# Patient Record
Sex: Male | Born: 1950 | Race: White | Hispanic: No | Marital: Single | Smoking: Never smoker
Health system: Southern US, Community
[De-identification: ages and names within clinical notes are randomized; demographics above are authoritative.]

## PROBLEM LIST (undated history)

## (undated) DIAGNOSIS — E785 Hyperlipidemia, unspecified: Secondary | ICD-10-CM

## (undated) HISTORY — PX: HERNIA REPAIR: SHX51

---

## 1999-04-14 ENCOUNTER — Encounter: Admission: RE | Admit: 1999-04-14 | Discharge: 1999-04-14 | Payer: Self-pay | Admitting: Family Medicine

## 1999-04-14 ENCOUNTER — Encounter: Payer: Self-pay | Admitting: Family Medicine

## 1999-05-18 ENCOUNTER — Other Ambulatory Visit: Admission: RE | Admit: 1999-05-18 | Discharge: 1999-05-18 | Payer: Self-pay | Admitting: Gastroenterology

## 2004-07-26 ENCOUNTER — Ambulatory Visit: Payer: Self-pay | Admitting: General Surgery

## 2004-10-11 ENCOUNTER — Ambulatory Visit: Payer: Self-pay | Admitting: Family Medicine

## 2005-07-28 ENCOUNTER — Ambulatory Visit: Payer: Self-pay | Admitting: Family Medicine

## 2008-03-28 ENCOUNTER — Emergency Department: Payer: Self-pay | Admitting: Emergency Medicine

## 2008-03-30 ENCOUNTER — Emergency Department: Payer: Self-pay | Admitting: Internal Medicine

## 2008-05-27 ENCOUNTER — Encounter: Admission: RE | Admit: 2008-05-27 | Discharge: 2008-08-25 | Payer: Self-pay | Admitting: Neurology

## 2015-12-30 DIAGNOSIS — M722 Plantar fascial fibromatosis: Secondary | ICD-10-CM | POA: Diagnosis not present

## 2015-12-30 DIAGNOSIS — S76111A Strain of right quadriceps muscle, fascia and tendon, initial encounter: Secondary | ICD-10-CM | POA: Diagnosis not present

## 2016-02-21 DIAGNOSIS — E7800 Pure hypercholesterolemia, unspecified: Secondary | ICD-10-CM | POA: Diagnosis not present

## 2016-02-21 DIAGNOSIS — Z0000 Encounter for general adult medical examination without abnormal findings: Secondary | ICD-10-CM | POA: Diagnosis not present

## 2016-02-25 DIAGNOSIS — Z0000 Encounter for general adult medical examination without abnormal findings: Secondary | ICD-10-CM | POA: Diagnosis not present

## 2016-03-23 ENCOUNTER — Emergency Department
Admission: EM | Admit: 2016-03-23 | Discharge: 2016-03-23 | Disposition: A | Discharge status: Home / Self Care | Payer: PPO | Attending: Emergency Medicine | Admitting: Emergency Medicine

## 2016-03-23 ENCOUNTER — Emergency Department: Payer: PPO

## 2016-03-23 ENCOUNTER — Encounter: Payer: Self-pay | Admitting: Emergency Medicine

## 2016-03-23 DIAGNOSIS — W19XXXA Unspecified fall, initial encounter: Secondary | ICD-10-CM

## 2016-03-23 DIAGNOSIS — Y92830 Public park as the place of occurrence of the external cause: Secondary | ICD-10-CM | POA: Insufficient documentation

## 2016-03-23 DIAGNOSIS — W010XXA Fall on same level from slipping, tripping and stumbling without subsequent striking against object, initial encounter: Secondary | ICD-10-CM | POA: Insufficient documentation

## 2016-03-23 DIAGNOSIS — Y9301 Activity, walking, marching and hiking: Secondary | ICD-10-CM | POA: Diagnosis not present

## 2016-03-23 DIAGNOSIS — Y999 Unspecified external cause status: Secondary | ICD-10-CM | POA: Diagnosis not present

## 2016-03-23 DIAGNOSIS — S79912A Unspecified injury of left hip, initial encounter: Secondary | ICD-10-CM | POA: Diagnosis not present

## 2016-03-23 DIAGNOSIS — S7002XA Contusion of left hip, initial encounter: Secondary | ICD-10-CM | POA: Diagnosis not present

## 2016-03-23 DIAGNOSIS — M25552 Pain in left hip: Secondary | ICD-10-CM | POA: Diagnosis not present

## 2016-03-23 DIAGNOSIS — S7012XA Contusion of left thigh, initial encounter: Secondary | ICD-10-CM | POA: Diagnosis not present

## 2016-03-23 MED ORDER — HYDROCODONE-ACETAMINOPHEN 5-325 MG PO TABS: 1.0000 | ORAL_TABLET | Freq: Four times a day (QID) | ORAL | 0 refills | Status: DC | PRN

## 2016-03-23 MED ORDER — NAPROXEN 500 MG PO TABS: 500.0000 mg | ORAL_TABLET | Freq: Two times a day (BID) | ORAL | 0 refills | Status: DC

## 2016-03-23 MED ORDER — BACLOFEN 10 MG PO TABS: 10.0000 mg | ORAL_TABLET | Freq: Three times a day (TID) | ORAL | 0 refills | Status: AC | PRN

## 2016-03-23 MED ORDER — KETOROLAC TROMETHAMINE 30 MG/ML IJ SOLN
30.0000 mg | Freq: Once | INTRAMUSCULAR | Status: AC
  Administered 2016-03-23: 30 mg via INTRAMUSCULAR
  Filled 2016-03-23: qty 1

## 2016-03-23 NOTE — ED Provider Notes (Signed)
[facility] Emergency Department Provider Note  ____________________________________________  Time seen: Approximately 4:13 PM  I have reviewed the triage vital signs and the nursing notes.   HISTORY  Chief Complaint Hip Pain and Fall    HPI Erik Donovan is a 66 y.o. male , NAD, presents to emergency department for evaluation of left hip pain after a fall. Patient states he was walking in a local park when he slipped in the snow and ice. Fell directly onto the left hip and thigh. Had immediate pain and swelling about the area after the incident. Denies any numbness, weakness or tingling. Denies head injury or neck pain. Has no back pain, saddle paresthesias or loss of bowel or bladder control. Denies any fatigue, visual changes, chest pain, shortness of breath. States that the fall was mechanical in nature.   History reviewed. No pertinent past medical history.  There are no active problems to display for this patient.   Past Surgical History:  Procedure Laterality Date  . HERNIA REPAIR      Prior to Admission medications   Medication Sig Start Date End Date Taking? Authorizing Provider  baclofen (LIORESAL) 10 MG tablet Take 1 tablet (10 mg total) by mouth 3 (three) times daily as needed for muscle spasms. 03/23/16 03/30/16  Jami L Hagler, PA-C  HYDROcodone-acetaminophen (NORCO) 5-325 MG tablet Take 1 tablet by mouth every 6 (six) hours as needed for severe pain. 03/23/16   Jami L Hagler, PA-C  naproxen (NAPROSYN) 500 MG tablet Take 1 tablet (500 mg total) by mouth 2 (two) times daily with a meal. 03/23/16   Jami L Hagler, PA-C    Allergies Patient has no known allergies.  No family history on file.  Social History Social History  Substance Use Topics  . Smoking status: Never Smoker  . Smokeless tobacco: Not on file  . Alcohol use No     Review of Systems  Constitutional: No fatigue Eyes: No visual changes. Cardiovascular: No chest  pain. Respiratory: No shortness of breath.  Musculoskeletal: Positive left hip and thigh pain. Negative for back or neck pain.  Skin: Positive swelling left thigh. Negative for rash or redness, bruising, open wounds or lacerations. Neurological: Negative for Numbness, weakness, tingling. No saddle paresthesias, loss of bowel or bladder control.  ____________________________________________   PHYSICAL EXAM:  VITAL SIGNS: ED Triage Vitals [03/23/16 1526]  Enc Vitals Group     BP 119/86     Pulse Rate 94     Resp 18     Temp 97.7 F (36.5 C)     Temp Source Oral     SpO2 96 %     Weight 200 lb (90.7 kg)     Height 5\' 10"  (1.778 m)     Head Circumference      Peak Flow      Pain Score 2     Pain Loc      Pain Edu?      Excl. in [place]?      Constitutional: Alert and oriented. Well appearing and in no acute distress. Eyes: Conjunctivae are normal.   Head: Atraumatic. Cardiovascular: Good peripheral circulation. Respiratory: Normal respiratory effort without tachypnea or retractions.  Musculoskeletal: Tenderness to palpation about the left posterior and lateral thigh with large hematoma present. No fluctuance to the hematoma is noted. No significant bruising and no redness or abnormal warmth. Patient has full range of motion of the left hip, knee without difficulty but does note pain about  the left lateral thigh with full flexion at the hip. No lower extremity tenderness nor edema.  No joint effusions. Neurologic:  Normal speech and language. Normal gait and posture. No gross focal neurologic deficits are appreciated.  Skin:  Skin is warm, dry and intact. No rash, open wounds or lacerations noted. Psychiatric: Mood and affect are normal. Speech and behavior are normal. Patient exhibits appropriate insight and judgement.   ____________________________________________    LABS  None ____________________________________________  EKG  None ____________________________________________  RADIOLOGY I, Logan, personally viewed and evaluated these images (plain radiographs) as part of my medical decision making, as well as reviewing the written report by the radiologist.  Dg Hip Unilat With Pelvis 2-3 Views Left  Result Date: 03/23/2016 CLINICAL DATA:  LEFT hip pain post fall while walking in the park today, swelling EXAM: DG HIP (WITH OR WITHOUT PELVIS) 2-3V LEFT COMPARISON:  None FINDINGS: Lateral soft tissue swelling LEFT hip region. Osseous mineralization normal. Joint spaces preserved. No acute fracture, dislocation, or bone destruction. IMPRESSION: No acute osseous abnormalities. Electronically Signed   By: Lavonia Dana M.D.   On: 03/23/2016 16:06    ____________________________________________    PROCEDURES  Procedure(s) performed: None   Procedures   Medications  ketorolac (TORADOL) 30 MG/ML injection 30 mg (30 mg Intramuscular Given 03/23/16 1625)     ____________________________________________   INITIAL IMPRESSION / ASSESSMENT AND PLAN / ED COURSE  Pertinent labs & imaging results that were available during my care of the patient were reviewed by me and considered in my medical decision making (see chart for details).     Patient's diagnosis is consistent with Hematoma of left thigh and contusion of left hip and thigh due to fall. Patient was given Toradol IM while in the emergency department and tolerated well without immediate side effects. Patient will be discharged home with prescriptions for baclofen, and Norco and Naprosyn to take as directed. Patient is to apply ice to the affected area 20 minutes 3-4 times daily and completely light range of motion and exercises as discussed. Patient is encouraged to walk for exercise and completeno strenuous forms of exercise until he is without pain. Patient is to follow up with  [facility] clinic last if symptoms persist past this treatment course. Patient is given ED precautions to return to the ED for any worsening or new symptoms.    ____________________________________________  FINAL CLINICAL IMPRESSION(S) / ED DIAGNOSES  Final diagnoses:  Hematoma of left thigh, initial encounter  Fall, initial encounter  Contusion of left hip and thigh, initial encounter      NEW MEDICATIONS STARTED DURING THIS VISIT:  New Prescriptions   BACLOFEN (LIORESAL) 10 MG TABLET    Take 1 tablet (10 mg total) by mouth 3 (three) times daily as needed for muscle spasms.   HYDROCODONE-ACETAMINOPHEN (NORCO) 5-325 MG TABLET    Take 1 tablet by mouth every 6 (six) hours as needed for severe pain.   NAPROXEN (NAPROSYN) 500 MG TABLET    Take 1 tablet (500 mg total) by mouth 2 (two) times daily with a meal.         Braxton Feathers, PA-C 03/23/16 [place], MD 03/23/16 2009

## 2016-03-23 NOTE — ED Notes (Signed)
Pt states he was walking in the park and slipped and fell, states left buttocks pain and bruising, awake and alert in no acute distress, ambulatory

## 2016-03-23 NOTE — ED Triage Notes (Signed)
Pt presents to ED with c/o L hip pain. Pt states fell approx 30 mins ago and noted some  swelling to L hip. Pt is ambulatory with no difficulty, c/o some pain at this time. Pt is alert and oriented, NAD noted at this time.

## 2016-03-31 DIAGNOSIS — M79605 Pain in left leg: Secondary | ICD-10-CM | POA: Diagnosis not present

## 2016-04-08 ENCOUNTER — Emergency Department: Payer: PPO

## 2016-04-08 ENCOUNTER — Emergency Department
Admission: EM | Admit: 2016-04-08 | Discharge: 2016-04-08 | Disposition: A | Discharge status: Home / Self Care | Payer: PPO | Attending: Emergency Medicine | Admitting: Emergency Medicine

## 2016-04-08 DIAGNOSIS — M545 Low back pain, unspecified: Secondary | ICD-10-CM

## 2016-04-08 DIAGNOSIS — Z79899 Other long term (current) drug therapy: Secondary | ICD-10-CM | POA: Insufficient documentation

## 2016-04-08 MED ORDER — MELOXICAM 7.5 MG PO TABS: 7.5000 mg | ORAL_TABLET | Freq: Every day | ORAL | 0 refills | Status: AC

## 2016-04-08 MED ORDER — OXYCODONE-ACETAMINOPHEN 7.5-325 MG PO TABS: 1.0000 | ORAL_TABLET | Freq: Four times a day (QID) | ORAL | 0 refills | Status: DC | PRN

## 2016-04-08 MED ORDER — KETOROLAC TROMETHAMINE 60 MG/2ML IM SOLN
60.0000 mg | Freq: Once | INTRAMUSCULAR | Status: AC
  Administered 2016-04-08: 60 mg via INTRAMUSCULAR
  Filled 2016-04-08: qty 2

## 2016-04-08 NOTE — ED Provider Notes (Signed)
[facility] Emergency Department Provider Note   ____________________________________________   First MD Initiated Contact with Patient 04/08/16 1228     (approximate)  I have reviewed the triage vital signs and the nursing notes.   HISTORY  Chief Complaint Back Pain    HPI Erik Donovan is a 67 y.o. male patient complain right low back pain since last night. Patient state pain increases when he was trying to come from a sitting to a standing position. Patient state he recently fell in the snow and sustaining a contusion to the left hip. Patient states since the fall he cannot sit or sleep with comfort. Patient states status post a fall he followed up with his family doctor and continue previous medications even though he believes it was not helping. Patient state no imaging with taken of his back because he did not have back pain with the initial fall. Patient is rating his pain as a 10 over 10. Patient denies any bladder or bowel dysfunction. Patient denies any radicular component to his back pain. No recent admissions of the patient's back. No palliative measures for this complaint. Patient described a pain as "sharp and achy".  History reviewed. No pertinent past medical history.  There are no active problems to display for this patient.   Past Surgical History:  Procedure Laterality Date  . HERNIA REPAIR      Prior to Admission medications   Medication Sig Start Date End Date Taking? Authorizing Provider  HYDROcodone-acetaminophen (NORCO) 5-325 MG tablet Take 1 tablet by mouth every 6 (six) hours as needed for severe pain. 03/23/16   Jami L Hagler, PA-C  meloxicam (MOBIC) 7.5 MG tablet Take 1 tablet (7.5 mg total) by mouth daily. 04/08/16 04/08/17  Sable Feil, PA-C  naproxen (NAPROSYN) 500 MG tablet Take 1 tablet (500 mg total) by mouth 2 (two) times daily with a meal. 03/23/16   Jami L Hagler, PA-C  oxyCODONE-acetaminophen (PERCOCET) 7.5-325 MG tablet  Take 1 tablet by mouth every 6 (six) hours as needed for severe pain. 04/08/16   Sable Feil, PA-C    Allergies Patient has no known allergies.  No family history on file.  Social History Social History  Substance Use Topics  . Smoking status: Never Smoker  . Smokeless tobacco: Never Used  . Alcohol use No    Review of Systems Constitutional: No fever/chills Eyes: No visual changes. ENT: No sore throat. Cardiovascular: Denies chest pain. Respiratory: Denies shortness of breath. Gastrointestinal: No abdominal pain.  No nausea, no vomiting.  No diarrhea.  No constipation. Genitourinary: Negative for dysuria. Musculoskeletal: Positive for back pain. Skin: Negative for rash. Neurological: Negative for headaches, focal weakness or numbness.   ____________________________________________   PHYSICAL EXAM:  VITAL SIGNS: ED Triage Vitals  Enc Vitals Group     BP 04/08/16 1201 (!) 162/91     Pulse Rate 04/08/16 1201 73     Resp 04/08/16 1201 16     Temp 04/08/16 1201 98.9 F (37.2 C)     Temp src --      SpO2 04/08/16 1201 100 %     Weight 04/08/16 1201 205 lb (93 kg)     Height 04/08/16 1201 5\' 10"  (1.778 m)     Head Circumference --      Peak Flow --      Pain Score 04/08/16 1220 10     Pain Loc --      Pain Edu? --  Excl. in [place]? --     Constitutional: Alert and oriented. Well appearing and in no acute distress. Eyes: Conjunctivae are normal. PERRL. EOMI. Head: Atraumatic. Nose: No congestion/rhinnorhea. Mouth/Throat: Mucous membranes are moist.  Oropharynx non-erythematous. Neck: No stridor.  No cervical spine tenderness to palpation. Hematological/Lymphatic/Immunilogical: No cervical lymphadenopathy. Cardiovascular: Normal rate, regular rhythm. Grossly normal heart sounds.  Good peripheral circulation. Respiratory: Normal respiratory effort.  No retractions. Lungs CTAB. Gastrointestinal: Soft and nontender. No distention. No abdominal bruits. No CVA  tenderness. Musculoskeletal: No lower extremity tenderness nor edema.  No joint effusions. Neurologic:  Normal speech and language. No gross focal neurologic deficits are appreciated. No gait instability. Skin:  Skin is warm, dry and intact. No rash noted. Psychiatric: Mood and affect are normal. Speech and behavior are normal.  ____________________________________________   LABS (all labs ordered are listed, but only abnormal results are displayed)  Labs Reviewed - No data to display ____________________________________________  EKG   ____________________________________________  RADIOLOGY  Multilevel DJD lumbar spine ____________________________________________   PROCEDURES  Procedure(s) performed: None  Procedures  Critical Care performed: No  ____________________________________________   INITIAL IMPRESSION / ASSESSMENT AND PLAN / ED COURSE  Pertinent labs & imaging results that were available during my care of the patient were reviewed by me and considered in my medical decision making (see chart for details).  Back pain secondary to DJD. Patient given discharge care instructions. Patient given a prescription for Percocet for 3 days and moped. Patient advised follow-up with family doctor for continued care.      ____________________________________________   FINAL CLINICAL IMPRESSION(S) / ED DIAGNOSES  Final diagnoses:  Acute midline low back pain without sciatica      NEW MEDICATIONS STARTED DURING THIS VISIT:  New Prescriptions   MELOXICAM (MOBIC) 7.5 MG TABLET    Take 1 tablet (7.5 mg total) by mouth daily.   OXYCODONE-ACETAMINOPHEN (PERCOCET) 7.5-325 MG TABLET    Take 1 tablet by mouth every 6 (six) hours as needed for severe pain.     Note:  This document was prepared using Dragon voice recognition software and may include unintentional dictation errors.    Sable Feil, PA-C 04/08/16 1403    Schuyler Amor, MD 04/08/16 [phone]

## 2016-04-08 NOTE — ED Triage Notes (Signed)
Pt came to ED c/o right lower sided back pain. Has chronic back pain. Recently fell due to the snow and reports not having worsening pain. Requesting pain meds, states " I cannot get comfortable and have not been sleeping."

## 2016-05-24 DIAGNOSIS — M25572 Pain in left ankle and joints of left foot: Secondary | ICD-10-CM | POA: Diagnosis not present

## 2016-05-24 DIAGNOSIS — M545 Low back pain: Secondary | ICD-10-CM | POA: Diagnosis not present

## 2016-05-24 DIAGNOSIS — B359 Dermatophytosis, unspecified: Secondary | ICD-10-CM | POA: Diagnosis not present

## 2016-05-26 ENCOUNTER — Encounter: Payer: Self-pay | Admitting: Emergency Medicine

## 2016-05-26 ENCOUNTER — Emergency Department
Admission: EM | Admit: 2016-05-26 | Discharge: 2016-05-26 | Disposition: A | Discharge status: Home / Self Care | Payer: PPO | Attending: Emergency Medicine | Admitting: Emergency Medicine

## 2016-05-26 ENCOUNTER — Emergency Department: Payer: PPO

## 2016-05-26 DIAGNOSIS — Z79899 Other long term (current) drug therapy: Secondary | ICD-10-CM | POA: Insufficient documentation

## 2016-05-26 DIAGNOSIS — R05 Cough: Secondary | ICD-10-CM | POA: Diagnosis not present

## 2016-05-26 DIAGNOSIS — J209 Acute bronchitis, unspecified: Secondary | ICD-10-CM | POA: Diagnosis not present

## 2016-05-26 DIAGNOSIS — M25571 Pain in right ankle and joints of right foot: Secondary | ICD-10-CM | POA: Insufficient documentation

## 2016-05-26 MED ORDER — AZITHROMYCIN 250 MG PO TABS: ORAL_TABLET | ORAL | 0 refills | Status: AC

## 2016-05-26 MED ORDER — HYDROCOD POLST-CPM POLST ER 10-8 MG/5ML PO SUER: 5.0000 mL | Freq: Two times a day (BID) | ORAL | 0 refills | Status: DC

## 2016-05-26 NOTE — ED Notes (Signed)
See triage note   States he has had a cough for about 1 week   States cough is non prod  But having some discomfort in chest   No fever

## 2016-05-26 NOTE — ED Provider Notes (Signed)
[facility] Emergency Department Provider Note   ____________________________________________   First MD Initiated Contact with Patient 05/26/16 1607     (approximate)  I have reviewed the triage vital signs and the nursing notes.   HISTORY  Chief Complaint Cough    HPI Erik Donovan is a 66 y.o. male patient stated productive cough for 1 week. Patient states cough is worse at night especially laying down. Patient states chest congestion and body aches. Patient also states intermittent sore throat.Patient rates his pain as a 5/10. Patient described a pain as "achy". No palliative measures for his complaint.   History reviewed. No pertinent past medical history.  There are no active problems to display for this patient.   Past Surgical History:  Procedure Laterality Date  . HERNIA REPAIR      Prior to Admission medications   Medication Sig Start Date End Date Taking? Authorizing Provider  atorvastatin (LIPITOR) 40 MG tablet Take 40 mg by mouth daily.   Yes Historical Provider, MD  fluconazole (DIFLUCAN) 100 MG tablet Take 100 mg by mouth daily.   Yes Historical Provider, MD  azithromycin (ZITHROMAX Z-PAK) 250 MG tablet Take 2 tablets (500 mg) on  Day 1,  followed by 1 tablet (250 mg) once daily on Days 2 through 5. 05/26/16 05/31/16  Sable Feil, PA-C  chlorpheniramine-HYDROcodone (TUSSIONEX PENNKINETIC ER) 10-8 MG/5ML SUER Take 5 mLs by mouth 2 (two) times daily. 05/26/16   Sable Feil, PA-C  HYDROcodone-acetaminophen (NORCO) 5-325 MG tablet Take 1 tablet by mouth every 6 (six) hours as needed for severe pain. 03/23/16   Jami L Hagler, PA-C  meloxicam (MOBIC) 7.5 MG tablet Take 1 tablet (7.5 mg total) by mouth daily. 04/08/16 04/08/17  Sable Feil, PA-C  naproxen (NAPROSYN) 500 MG tablet Take 1 tablet (500 mg total) by mouth 2 (two) times daily with a meal. 03/23/16   Jami L Hagler, PA-C  oxyCODONE-acetaminophen (PERCOCET) 7.5-325 MG tablet Take  1 tablet by mouth every 6 (six) hours as needed for severe pain. 04/08/16   Sable Feil, PA-C    Allergies Patient has no known allergies.  History reviewed. No pertinent family history.  Social History Social History  Substance Use Topics  . Smoking status: Never Smoker  . Smokeless tobacco: Never Used  . Alcohol use No    Review of Systems Constitutional: No fever/chills Eyes: No visual changes. ENT: No sore throat. Cardiovascular: Denies chest pain. Respiratory: Denies shortness of breath.Productive cough Gastrointestinal: No abdominal pain.  No nausea, no vomiting.  No diarrhea.  No constipation. Genitourinary: Negative for dysuria. Musculoskeletal: Right ankle pain  Skin: Negative for rash. Neurological: Negative for headaches, focal weakness or numbness.    ____________________________________________   PHYSICAL EXAM:  VITAL SIGNS: ED Triage Vitals  Enc Vitals Group     BP 05/26/16 1500 119/80     Pulse Rate 05/26/16 1500 68     Resp 05/26/16 1500 18     Temp 05/26/16 1500 98.8 F (37.1 C)     Temp Source 05/26/16 1500 Oral     SpO2 05/26/16 1500 98 %     Weight 05/26/16 1500 204 lb (92.5 kg)     Height 05/26/16 1500 5\' 10"  (1.778 m)     Head Circumference --      Peak Flow --      Pain Score 05/26/16 1505 5     Pain Loc --      Pain Edu? --  Excl. in [place]? --     Constitutional: Alert and oriented. Well appearing and in no acute distress. Eyes: Conjunctivae are normal. PERRL. EOMI. Head: Atraumatic. Nose: No congestion/rhinnorhea. Mouth/Throat: Mucous membranes are moist.  Oropharynx non-erythematous. Neck: No stridor.  No cervical spine tenderness to palpation. Hematological/Lymphatic/Immunilogical: No cervical lymphadenopathy. Cardiovascular: Normal rate, regular rhythm. Grossly normal heart sounds.  Good peripheral circulation. Respiratory: Normal respiratory effort.  No retractions. Lungs Rales. Gastrointestinal: Soft and nontender. No  distention. No abdominal bruits. No CVA tenderness. Musculoskeletal: No lower extremity tenderness nor edema.  No joint effusions. Neurologic:  Normal speech and language. No gross focal neurologic deficits are appreciated. No gait instability. Skin:  Skin is warm, dry and intact. No rash noted. Psychiatric: Mood and affect are normal. Speech and behavior are normal.  ____________________________________________   LABS (all labs ordered are listed, but only abnormal results are displayed)  Labs Reviewed - No data to display ____________________________________________  EKG   ____________________________________________  RADIOLOGY  No acute findings on chest x-ray. ____________________________________________   PROCEDURES  Procedure(s) performed: None  Procedures  Critical Care performed: No  ____________________________________________   INITIAL IMPRESSION / ASSESSMENT AND PLAN / ED COURSE  Pertinent labs & imaging results that were available during my care of the patient were reviewed by me and considered in my medical decision making (see chart for details).  Bronchitis. Patient given discharge care instructions. Patient given a prescription for Zithromax and Tussionex. Patient advised follow-up family doctor condition persists.      ____________________________________________   FINAL CLINICAL IMPRESSION(S) / ED DIAGNOSES  Final diagnoses:  Acute bronchitis, unspecified organism      NEW MEDICATIONS STARTED DURING THIS VISIT:  Discharge Medication List as of 05/26/2016  4:44 PM    START taking these medications   Details  azithromycin (ZITHROMAX Z-PAK) 250 MG tablet Take 2 tablets (500 mg) on  Day 1,  followed by 1 tablet (250 mg) once daily on Days 2 through 5., Print    chlorpheniramine-HYDROcodone (TUSSIONEX PENNKINETIC ER) 10-8 MG/5ML SUER Take 5 mLs by mouth 2 (two) times daily., Starting Fri 05/26/2016, Print         Note:  This document  was prepared using Dragon voice recognition software and may include unintentional dictation errors.            Sable Feil, PA-C 05/26/16 [place], MD 05/26/16 [phone]

## 2016-05-26 NOTE — ED Triage Notes (Signed)
Pt reports cough x 1 week, pt states cough is worse at night when laying down.  Reports cough is non-productive. Pt reports congestion and body aches as well. C/o sore throat at times as well

## 2016-06-06 DIAGNOSIS — J449 Chronic obstructive pulmonary disease, unspecified: Secondary | ICD-10-CM | POA: Diagnosis not present

## 2016-06-15 DIAGNOSIS — J449 Chronic obstructive pulmonary disease, unspecified: Secondary | ICD-10-CM | POA: Diagnosis not present

## 2016-06-15 DIAGNOSIS — R05 Cough: Secondary | ICD-10-CM | POA: Diagnosis not present

## 2016-06-23 DIAGNOSIS — M25572 Pain in left ankle and joints of left foot: Secondary | ICD-10-CM | POA: Diagnosis not present

## 2016-06-23 DIAGNOSIS — S93402A Sprain of unspecified ligament of left ankle, initial encounter: Secondary | ICD-10-CM | POA: Diagnosis not present

## 2016-07-06 DIAGNOSIS — J189 Pneumonia, unspecified organism: Secondary | ICD-10-CM | POA: Diagnosis not present

## 2016-07-06 DIAGNOSIS — R938 Abnormal findings on diagnostic imaging of other specified body structures: Secondary | ICD-10-CM | POA: Diagnosis not present

## 2016-09-14 DIAGNOSIS — K219 Gastro-esophageal reflux disease without esophagitis: Secondary | ICD-10-CM | POA: Diagnosis not present

## 2016-09-14 DIAGNOSIS — K645 Perianal venous thrombosis: Secondary | ICD-10-CM | POA: Diagnosis not present

## 2016-11-28 DIAGNOSIS — J312 Chronic pharyngitis: Secondary | ICD-10-CM | POA: Diagnosis not present

## 2016-11-28 DIAGNOSIS — Z0000 Encounter for general adult medical examination without abnormal findings: Secondary | ICD-10-CM | POA: Diagnosis not present

## 2016-11-28 DIAGNOSIS — E7800 Pure hypercholesterolemia, unspecified: Secondary | ICD-10-CM | POA: Diagnosis not present

## 2016-12-12 DIAGNOSIS — F458 Other somatoform disorders: Secondary | ICD-10-CM | POA: Diagnosis not present

## 2016-12-12 DIAGNOSIS — K219 Gastro-esophageal reflux disease without esophagitis: Secondary | ICD-10-CM | POA: Diagnosis not present

## 2016-12-12 DIAGNOSIS — J301 Allergic rhinitis due to pollen: Secondary | ICD-10-CM | POA: Diagnosis not present

## 2017-06-12 DIAGNOSIS — M778 Other enthesopathies, not elsewhere classified: Secondary | ICD-10-CM | POA: Diagnosis not present

## 2017-06-12 DIAGNOSIS — M25522 Pain in left elbow: Secondary | ICD-10-CM | POA: Diagnosis not present

## 2017-06-12 DIAGNOSIS — M7702 Medial epicondylitis, left elbow: Secondary | ICD-10-CM | POA: Diagnosis not present

## 2020-04-26 DIAGNOSIS — M25511 Pain in right shoulder: Secondary | ICD-10-CM | POA: Diagnosis not present

## 2020-05-15 DIAGNOSIS — M25511 Pain in right shoulder: Secondary | ICD-10-CM | POA: Diagnosis not present

## 2020-06-02 DIAGNOSIS — M75121 Complete rotator cuff tear or rupture of right shoulder, not specified as traumatic: Secondary | ICD-10-CM | POA: Diagnosis not present

## 2020-06-02 DIAGNOSIS — M19011 Primary osteoarthritis, right shoulder: Secondary | ICD-10-CM | POA: Diagnosis not present

## 2020-06-10 DIAGNOSIS — E7800 Pure hypercholesterolemia, unspecified: Secondary | ICD-10-CM | POA: Diagnosis not present

## 2020-06-10 DIAGNOSIS — R7309 Other abnormal glucose: Secondary | ICD-10-CM | POA: Diagnosis not present

## 2020-06-10 DIAGNOSIS — Z0000 Encounter for general adult medical examination without abnormal findings: Secondary | ICD-10-CM | POA: Diagnosis not present

## 2020-06-10 DIAGNOSIS — Z1211 Encounter for screening for malignant neoplasm of colon: Secondary | ICD-10-CM | POA: Diagnosis not present

## 2020-06-15 DIAGNOSIS — Z1211 Encounter for screening for malignant neoplasm of colon: Secondary | ICD-10-CM | POA: Diagnosis not present

## 2020-07-26 DIAGNOSIS — S46811A Strain of other muscles, fascia and tendons at shoulder and upper arm level, right arm, initial encounter: Secondary | ICD-10-CM | POA: Diagnosis not present

## 2021-03-04 DIAGNOSIS — R21 Rash and other nonspecific skin eruption: Secondary | ICD-10-CM | POA: Diagnosis not present

## 2021-03-04 DIAGNOSIS — R7303 Prediabetes: Secondary | ICD-10-CM | POA: Diagnosis not present

## 2021-05-18 ENCOUNTER — Other Ambulatory Visit: Payer: Self-pay

## 2021-05-18 ENCOUNTER — Emergency Department
Admission: EM | Admit: 2021-05-18 | Discharge: 2021-05-18 | Disposition: A | Discharge status: Home / Self Care | Payer: PPO | Attending: Emergency Medicine | Admitting: Emergency Medicine

## 2021-05-18 ENCOUNTER — Emergency Department: Payer: PPO

## 2021-05-18 DIAGNOSIS — M7061 Trochanteric bursitis, right hip: Secondary | ICD-10-CM | POA: Insufficient documentation

## 2021-05-18 DIAGNOSIS — S79921A Unspecified injury of right thigh, initial encounter: Secondary | ICD-10-CM | POA: Diagnosis present

## 2021-05-18 DIAGNOSIS — M79651 Pain in right thigh: Secondary | ICD-10-CM | POA: Insufficient documentation

## 2021-05-18 DIAGNOSIS — M19011 Primary osteoarthritis, right shoulder: Secondary | ICD-10-CM | POA: Diagnosis not present

## 2021-05-18 DIAGNOSIS — M25551 Pain in right hip: Secondary | ICD-10-CM | POA: Diagnosis not present

## 2021-05-18 DIAGNOSIS — X509XXA Other and unspecified overexertion or strenuous movements or postures, initial encounter: Secondary | ICD-10-CM | POA: Insufficient documentation

## 2021-05-18 DIAGNOSIS — M1711 Unilateral primary osteoarthritis, right knee: Secondary | ICD-10-CM | POA: Diagnosis not present

## 2021-05-18 DIAGNOSIS — R52 Pain, unspecified: Secondary | ICD-10-CM

## 2021-05-18 DIAGNOSIS — M25511 Pain in right shoulder: Secondary | ICD-10-CM | POA: Diagnosis not present

## 2021-05-18 DIAGNOSIS — M7060 Trochanteric bursitis, unspecified hip: Secondary | ICD-10-CM | POA: Diagnosis not present

## 2021-05-18 DIAGNOSIS — S76111A Strain of right quadriceps muscle, fascia and tendon, initial encounter: Secondary | ICD-10-CM | POA: Insufficient documentation

## 2021-05-18 DIAGNOSIS — Y93B9 Activity, other involving muscle strengthening exercises: Secondary | ICD-10-CM | POA: Insufficient documentation

## 2021-05-18 DIAGNOSIS — M1611 Unilateral primary osteoarthritis, right hip: Secondary | ICD-10-CM | POA: Diagnosis not present

## 2021-05-18 DIAGNOSIS — M19019 Primary osteoarthritis, unspecified shoulder: Secondary | ICD-10-CM

## 2021-05-18 MED ORDER — MELOXICAM 15 MG PO TABS: 15.0000 mg | ORAL_TABLET | Freq: Every day | ORAL | 2 refills | Status: DC

## 2021-05-18 NOTE — Discharge Instructions (Signed)
Please follow-up with orthopedics. ? ?Return to the emergency department if symptoms change or worsen and you are unable to see primary care or orthopedics. ?

## 2021-05-18 NOTE — ED Provider Notes (Signed)
? ?[facility] ?Provider Note ? ? ? Event Date/Time  ? First MD Initiated Contact with Patient 05/18/21 1553   ?  (approximate) ? ? ?History  ? ?Knee Pain ? ? ?HPI ? ?Erik Donovan is a 71 y.o. male presents to the emergency department for treatment and evaluation of right thigh pain that started while exercising today.  He has had this in the past but it went away without intervention.he is also having pain in the right shoulder.  This has become chronic issue and he is unsure of the underlying cause.  No alleviating measures attempted prior to arrival.  ? ? ?Physical Exam  ? ?Triage Vital Signs: ?ED Triage Vitals  ?Enc Vitals Group  ?   BP 05/18/21 1440 (!) 153/100  ?   Pulse Rate 05/18/21 1440 89  ?   Resp 05/18/21 1440 17  ?   Temp 05/18/21 1440 98.3 ?F (36.8 ?C)  ?   Temp Source 05/18/21 1440 Oral  ?   SpO2 05/18/21 1440 97 %  ?   Weight 05/18/21 1440 190 lb (86.2 kg)  ?   Height 05/18/21 1440 '5\' 10"'$  (1.778 m)  ?   Head Circumference --   ?   Peak Flow --   ?   Pain Score 05/18/21 1445 5  ?   Pain Loc --   ?   Pain Edu? --   ?   Excl. in [place]? --   ? ? ?Most recent vital signs: ?Vitals:  ? 05/18/21 1440  ?BP: (!) 153/100  ?Pulse: 89  ?Resp: 17  ?Temp: 98.3 ?F (36.8 ?C)  ?SpO2: 97%  ? ? ?General: Awake, no distress.  ?CV:  Good peripheral perfusion.  ?Resp:  Normal effort.  ?Abd:  No distention.  ?Other:  Tenderness in the mid quadricep that radiates into the right hip.  No focal knee pain.  Right shoulder pain in the area of the acromion and bicep.  No obvious tendon injury.  Limited range of motion of the shoulder secondary to pain. ? ? ?ED Results / Procedures / Treatments  ? ?Labs ?(all labs ordered are listed, but only abnormal results are displayed) ?Labs Reviewed - No data to display ? ? ?EKG ? ?Not indicated ? ? ?RADIOLOGY ? ?Image and radiology report reviewed by me. ? ?Image of the right shoulder and right hip and pelvis are negative for acute bony abnormality.  Shoulder image does  show significant amount of arthritis at the [facility] ? ?Ultrasound soft tissue lower extremity shows small fluid collection overlying the greater trochanter, possibly bursitis. ? ?PROCEDURES: ? ?Critical Care performed: No ? ?Procedures ? ? ?MEDICATIONS ORDERED IN ED: ?Medications - No data to display ? ? ?IMPRESSION / MDM / ASSESSMENT AND PLAN / ED COURSE  ? ?I have reviewed the triage note. ? ?Differential diagnosis includes, but is not limited to, osteoarthritis, tendinitis, quadricep strain ? ?71 year old male presenting to the emergency department for treatment and evaluation of right hip and right shoulder pain.  See HPI for further details. ? ?Images ordered while awaiting ER room assignment are negative for acute bony abnormality.  He does have significant arthritis in the right shoulder.  This is likely the cause of his chronic shoulder pain.  Ultrasound to evaluate for hematoma/bursitis/tendon injury/muscle tear ordered. ? ?Ultrasound shows a small fluid collection overlying the greater trochanter which is likely bursitis.  ? ?Meloxicam prescribed.  Patient is to schedule follow-up appointment with orthopedics.  For symptoms that  change or worsen if he is unable to see primary care or the specialist he is to return to the emergency department. ? ?  ? ? ?FINAL CLINICAL IMPRESSION(S) / ED DIAGNOSES  ? ?Final diagnoses:  ?Sprain, quadricep, right, initial encounter  ?Trochanteric bursitis of right hip  ?Osteoarthritis of AC (acromioclavicular) joint  ? ? ? ?Rx / DC Orders  ? ?ED Discharge Orders   ? ?      Ordered  ?  meloxicam (MOBIC) 15 MG tablet  Daily       ? 05/18/21 1800  ? ?  ?  ? ?  ? ? ? ?Note:  This document was prepared using Dragon voice recognition software and may include unintentional dictation errors. ?  Victorino Dike, FNP ?05/18/21 2301 ? ?  ?Naaman Plummer, MD ?05/19/21 1501 ? ?

## 2021-05-18 NOTE — ED Triage Notes (Signed)
Pt comes with c/o right knee pain. Pt states he was at the gym and went to push on the leg press and then felt the pain.  ? ?Pt also states right shoulder pain. ?

## 2021-05-25 DIAGNOSIS — G8929 Other chronic pain: Secondary | ICD-10-CM | POA: Diagnosis not present

## 2021-05-25 DIAGNOSIS — M25511 Pain in right shoulder: Secondary | ICD-10-CM | POA: Diagnosis not present

## 2021-05-25 DIAGNOSIS — M79604 Pain in right leg: Secondary | ICD-10-CM | POA: Diagnosis not present

## 2021-06-01 DIAGNOSIS — R0789 Other chest pain: Secondary | ICD-10-CM | POA: Diagnosis not present

## 2021-06-01 DIAGNOSIS — E7800 Pure hypercholesterolemia, unspecified: Secondary | ICD-10-CM | POA: Diagnosis not present

## 2021-06-01 DIAGNOSIS — R7303 Prediabetes: Secondary | ICD-10-CM | POA: Diagnosis not present

## 2021-06-09 ENCOUNTER — Encounter: Payer: Self-pay | Admitting: Internal Medicine

## 2021-06-09 ENCOUNTER — Observation Stay
Admission: EM | Admit: 2021-06-09 | Discharge: 2021-06-10 | Disposition: A | Discharge status: Home / Self Care | Payer: PPO | Attending: Emergency Medicine | Admitting: Emergency Medicine

## 2021-06-09 ENCOUNTER — Other Ambulatory Visit: Payer: Self-pay

## 2021-06-09 DIAGNOSIS — K921 Melena: Secondary | ICD-10-CM

## 2021-06-09 DIAGNOSIS — K922 Gastrointestinal hemorrhage, unspecified: Secondary | ICD-10-CM | POA: Diagnosis present

## 2021-06-09 DIAGNOSIS — K264 Chronic or unspecified duodenal ulcer with hemorrhage: Secondary | ICD-10-CM | POA: Diagnosis not present

## 2021-06-09 DIAGNOSIS — E785 Hyperlipidemia, unspecified: Secondary | ICD-10-CM | POA: Diagnosis not present

## 2021-06-09 DIAGNOSIS — K222 Esophageal obstruction: Secondary | ICD-10-CM | POA: Diagnosis not present

## 2021-06-09 DIAGNOSIS — Z79899 Other long term (current) drug therapy: Secondary | ICD-10-CM | POA: Insufficient documentation

## 2021-06-09 DIAGNOSIS — K269 Duodenal ulcer, unspecified as acute or chronic, without hemorrhage or perforation: Secondary | ICD-10-CM

## 2021-06-09 HISTORY — DX: Hyperlipidemia, unspecified: E78.5

## 2021-06-09 LAB — TYPE AND SCREEN
ABO/RH(D): A POS
Antibody Screen: NEGATIVE

## 2021-06-09 LAB — CBC
HCT: 40.1 % (ref 39.0–52.0)
Hemoglobin: 13.2 g/dL (ref 13.0–17.0)
MCH: 29 pg (ref 26.0–34.0)
MCHC: 32.9 g/dL (ref 30.0–36.0)
MCV: 88.1 fL (ref 80.0–100.0)
Platelets: 263 10*3/uL (ref 150–400)
RBC: 4.55 MIL/uL (ref 4.22–5.81)
RDW: 13.6 % (ref 11.5–15.5)
WBC: 7.5 10*3/uL (ref 4.0–10.5)
nRBC: 0 % (ref 0.0–0.2)

## 2021-06-09 LAB — CBC WITH DIFFERENTIAL/PLATELET
Abs Immature Granulocytes: 0.03 10*3/uL (ref 0.00–0.07)
Basophils Absolute: 0.1 10*3/uL (ref 0.0–0.1)
Basophils Relative: 1 %
Eosinophils Absolute: 0.1 10*3/uL (ref 0.0–0.5)
Eosinophils Relative: 2 %
HCT: 40.4 % (ref 39.0–52.0)
Hemoglobin: 13.2 g/dL (ref 13.0–17.0)
Immature Granulocytes: 0 %
Lymphocytes Relative: 27 %
Lymphs Abs: 1.9 10*3/uL (ref 0.7–4.0)
MCH: 28.9 pg (ref 26.0–34.0)
MCHC: 32.7 g/dL (ref 30.0–36.0)
MCV: 88.4 fL (ref 80.0–100.0)
Monocytes Absolute: 0.4 10*3/uL (ref 0.1–1.0)
Monocytes Relative: 6 %
Neutro Abs: 4.5 10*3/uL (ref 1.7–7.7)
Neutrophils Relative %: 64 %
Platelets: 253 10*3/uL (ref 150–400)
RBC: 4.57 MIL/uL (ref 4.22–5.81)
RDW: 13.6 % (ref 11.5–15.5)
WBC: 7.1 10*3/uL (ref 4.0–10.5)
nRBC: 0 % (ref 0.0–0.2)

## 2021-06-09 LAB — COMPREHENSIVE METABOLIC PANEL
ALT: 19 U/L (ref 0–44)
AST: 34 U/L (ref 15–41)
Albumin: 4 g/dL (ref 3.5–5.0)
Alkaline Phosphatase: 63 U/L (ref 38–126)
Anion gap: 10 (ref 5–15)
BUN: 18 mg/dL (ref 8–23)
CO2: 23 mmol/L (ref 22–32)
Calcium: 9.3 mg/dL (ref 8.9–10.3)
Chloride: 105 mmol/L (ref 98–111)
Creatinine, Ser: 1.05 mg/dL (ref 0.61–1.24)
GFR, Estimated: >60 mL/min (ref >60)
Glucose, Bld: 114 mg/dL — ABNORMAL HIGH (ref 70–99)
Potassium: 4 mmol/L (ref 3.5–5.1)
Sodium: 138 mmol/L (ref 135–145)
Total Bilirubin: 1.6 mg/dL — ABNORMAL HIGH (ref 0.3–1.2)
Total Protein: 7.5 g/dL (ref 6.5–8.1)

## 2021-06-09 LAB — PROTIME-INR
INR: 1.1 (ref 0.8–1.2)
Prothrombin Time: 13.8 seconds (ref 11.4–15.2)

## 2021-06-09 LAB — APTT: aPTT: 36 seconds (ref 24–36)

## 2021-06-09 MED ORDER — ONDANSETRON HCL 4 MG/2ML IJ SOLN: 4.0000 mg | Freq: Three times a day (TID) | INTRAMUSCULAR | Status: DC | PRN

## 2021-06-09 MED ORDER — ACETAMINOPHEN 325 MG PO TABS: 650.0000 mg | ORAL_TABLET | Freq: Four times a day (QID) | ORAL | Status: DC | PRN

## 2021-06-09 MED ORDER — PANTOPRAZOLE 80MG IVPB - SIMPLE MED
80.0000 mg | Freq: Once | INTRAVENOUS | Status: AC
  Administered 2021-06-09: 80 mg via INTRAVENOUS
  Filled 2021-06-09: qty 100

## 2021-06-09 MED ORDER — PANTOPRAZOLE INFUSION (NEW) - SIMPLE MED
8.0000 mg/h | INTRAVENOUS | Status: DC
  Administered 2021-06-09 – 2021-06-10 (×2): 8 mg/h via INTRAVENOUS
  Filled 2021-06-09 (×3): qty 100

## 2021-06-09 MED ORDER — SODIUM CHLORIDE 0.9 % IV SOLN: INTRAVENOUS | Status: DC

## 2021-06-09 NOTE — Plan of Care (Signed)
  Problem: Education: Goal: Knowledge of General Education information will improve Description Including pain rating scale, medication(s)/side effects and non-pharmacologic comfort measures Outcome: Progressing   

## 2021-06-09 NOTE — ED Triage Notes (Signed)
Pt here with dark stools for 3-4 days. Pt denies pain or blood thinners. Pt denies N/V/D. Pt stable. ?

## 2021-06-09 NOTE — ED Notes (Signed)
Informed RN bed assigned 

## 2021-06-09 NOTE — H&P (Signed)
?History and Physical  ? ? ?Erik Donovan NLZ:[id number] DOB: [birth year] DOA: 06/09/2021 ? ?Referring MD/NP/PA:  ? ?PCP: Kirk Ruths, MD  ? ?Patient coming from:  The patient is coming from home.  At baseline, pt is independent for most of ADL.       ? ?Chief Complaint: dark stool ? ?HPI: Erik Donovan is a 71 y.o. male with medical history significant of HLD, who presents with dark stool. ? ?Pt states that he has black stool for the last 2 days, He  has had 3 episodes of dark stool bowel movement.  He states that he takes ibuprofen and Mobic intermittently for shoulder pain.  Denies nausea, vomiting or abdominal pain.  No chest pain, cough, shortness breath.  No dizziness or lightheadedness. ? ?Data Reviewed and ED Course: pt was found to have hemoglobin 13.2, WBC 7.1, INR 1.1, positive FOBT per EDP, electrolytes renal function okay, temperature normal, blood pressure 167/95, heart rate 73, RR 18, oxygen saturation 100% on room air.  Patient is placed on MedSurg bed for position, Dr. Allen Norris of GI is consulted ? ? ?EKG:  Not done in ED, will get one.  ? ? ?Review of Systems:  ? ?General: no fevers, chills, no body weight gain, fatigue ?HEENT: no blurry vision, hearing changes or sore throat ?Respiratory: no dyspnea, coughing, wheezing ?CV: no chest pain, no palpitations ?GI: no nausea, vomiting, abdominal pain, diarrhea, constipation ?GU: no dysuria, burning on urination, increased urinary frequency, hematuria  ?Ext: no leg edema ?Neuro: no unilateral weakness, numbness, or tingling, no vision change or hearing loss ?Skin: no rash, no skin tear. ?MSK: No muscle spasm, no deformity, no limitation of range of movement in spin ?Heme: No easy bruising.  ?Travel history: No recent long distant travel. ? ? ?Allergy: No Known Allergies ? ?Past Medical History:  ?Diagnosis Date  ? HLD (hyperlipidemia)   ? ? ?Past Surgical History:  ?Procedure Laterality Date  ? HERNIA REPAIR    ? ? ?Social History:  reports that he has  never smoked. He has never used smokeless tobacco. He reports that he does not drink alcohol and does not use drugs. ? ?Family History:  ?Family History  ?Problem Relation Age of Onset  ? Heart failure Mother   ? Heart attack Brother   ?  ? ?Prior to Admission medications   ?Medication Sig Start Date End Date Taking? Authorizing Provider  ?atorvastatin (LIPITOR) 40 MG tablet Take 40 mg by mouth daily.    [provider]  ?chlorpheniramine-HYDROcodone (TUSSIONEX PENNKINETIC ER) 10-8 MG/5ML SUER Take 5 mLs by mouth 2 (two) times daily. 05/26/16   Sable Feil, PA-C  ?fluconazole (DIFLUCAN) 100 MG tablet Take 100 mg by mouth daily.    [provider]  ?HYDROcodone-acetaminophen (NORCO) 5-325 MG tablet Take 1 tablet by mouth every 6 (six) hours as needed for severe pain. 03/23/16   Hagler, Jami L, PA-C  ?meloxicam (MOBIC) 15 MG tablet Take 1 tablet (15 mg total) by mouth daily. 05/18/21 05/18/22  Triplett, Johnette Abraham B, FNP  ?oxyCODONE-acetaminophen (PERCOCET) 7.5-325 MG tablet Take 1 tablet by mouth every 6 (six) hours as needed for severe pain. 04/08/16   Sable Feil, PA-C  ? ? ?Physical Exam: ?Vitals:  ? 06/09/21 1352 06/09/21 1630 06/09/21 1800  ?BP: (!) 167/95 (!) 152/87 (!) 174/85  ?Pulse: 73 77 (!) 50  ?Resp: '18 18 18  '$ ?Temp:   98.5 ?F (36.9 ?C)  ?TempSrc:   Oral  ?SpO2: 100%  100% 97%  ?Weight: 88.5 kg    ?Height: '5\' 9"'$  (1.753 m)    ? ?General: Not in acute distress ?HEENT: ?      Eyes: PERRL, EOMI, no scleral icterus. ?      ENT: No discharge from the ears and nose, no pharynx injection, no tonsillar enlargement.  ?      Neck: No JVD, no bruit, no mass felt. ?Heme: No neck lymph node enlargement. ?Cardiac: S1/S2, RRR, No murmurs, No gallops or rubs. ?Respiratory: No rales, wheezing, rhonchi or rubs. ?GI: Soft, nondistended, nontender, no rebound pain, no organomegaly, BS present. ?GU: No hematuria ?Ext: No pitting leg edema bilaterally. 1+DP/PT pulse bilaterally. ?Musculoskeletal: No joint  deformities, No joint redness or warmth, no limitation of ROM in spin. ?Skin: No rashes.  ?Neuro: Alert, oriented X3, cranial nerves II-XII grossly intact, moves all extremities normally. ?Psych: Patient is not psychotic, no suicidal or hemocidal ideation. ? ?Labs on Admission: I have personally reviewed following labs and imaging studies ? ?CBC: ?Recent Labs  ?Lab 06/09/21 ?1430 06/09/21 ?1547  ?WBC 7.1 7.5  ?NEUTROABS 4.5  --   ?HGB 13.2 13.2  ?HCT 40.4 40.1  ?MCV 88.4 88.1  ?PLT 253 263  ? ?Basic Metabolic Panel: ?Recent Labs  ?Lab 06/09/21 ?1430  ?NA 138  ?K 4.0  ?CL 105  ?CO2 23  ?GLUCOSE 114*  ?BUN 18  ?CREATININE 1.05  ?CALCIUM 9.3  ? ?GFR: ?Estimated Creatinine Clearance: 72 mL/min (by C-G formula based on SCr of 1.05 mg/dL). ?Liver Function Tests: ?Recent Labs  ?Lab 06/09/21 ?1430  ?AST 34  ?ALT 19  ?ALKPHOS 63  ?BILITOT 1.6*  ?PROT 7.5  ?ALBUMIN 4.0  ? ?No results for input(s): LIPASE, AMYLASE in the last 168 hours. ?No results for input(s): AMMONIA in the last 168 hours. ?Coagulation Profile: ?Recent Labs  ?Lab 06/09/21 ?1430  ?INR 1.1  ? ?Cardiac Enzymes: ?No results for input(s): CKTOTAL, CKMB, CKMBINDEX, TROPONINI in the last 168 hours. ?BNP (last 3 results) ?No results for input(s): PROBNP in the last 8760 hours. ?HbA1C: ?No results for input(s): HGBA1C in the last 72 hours. ?CBG: ?No results for input(s): GLUCAP in the last 168 hours. ?Lipid Profile: ?No results for input(s): CHOL, HDL, LDLCALC, TRIG, CHOLHDL, LDLDIRECT in the last 72 hours. ?Thyroid Function Tests: ?No results for input(s): TSH, T4TOTAL, FREET4, T3FREE, THYROIDAB in the last 72 hours. ?Anemia Panel: ?No results for input(s): VITAMINB12, FOLATE, FERRITIN, TIBC, IRON, RETICCTPCT in the last 72 hours. ?Urine analysis: ?No results found for: COLORURINE, APPEARANCEUR, [place], [place], [place], [place], [place], KETONESUR, PROTEINUR, [place], NITRITE, LEUKOCYTESUR ?Sepsis Labs: ?'@LABRCNTIP'$ (procalcitonin:4,lacticidven:4) ?)No  results found for this or any previous visit (from the past 240 hour(s)).  ? ?Radiological Exams on Admission: ?No results found. ? ? ? ?Assessment/Plan ?Principal Problem: ?  GI bleeding ?Active Problems: ?  HLD (hyperlipidemia) ?  Acute upper GI bleed ? ? ?GI bleeding: likely acute upper GIB. Hgb stable 13.2. pt is taking ibuprofen and Mobic intermittently.  Consulted Dr. Allen Norris of GI ? ?- will place in med-surg bed obs ?- IVF: 75 mL/hr of normal saline ?- Start IV pantoprazole gtt ?- Zofran IV for nausea ?- Avoid NSAIDs and SQ heparin ?- Maintain IV access (2 large bore IVs if possible). ?- Monitor closely and follow q6h cbc, transfuse as necessary, if Hgb<7.0 ?- LaB: INR, PTT and type screen ? ?HLD (hyperlipidemia): not taking meds now ?-f/u with PCP ? ? ? ? ? ? ?DVT ppx: SCD ? ?Code Status: Full code ? ?  Family Communication: not done, no family member is at bed side.       ? ?Disposition Plan:  Anticipate discharge back to previous environment ? ?Consults called:  Dr. Allen Norris of GI ? ?Admission status and  ?Level of care: Med-Surg:    for obs  ? ? ?Severity of Illness: ? ?The appropriate patient status for this patient is OBSERVATION. Observation status is judged to be reasonable and necessary in order to provide the required intensity of service to ensure the patient's safety. The patient's presenting symptoms, physical exam findings, and initial radiographic and laboratory data in the context of their medical condition is felt to place them at decreased risk for further clinical deterioration. Furthermore, it is anticipated that the patient will be medically stable for discharge from the hospital within 2 midnights of admission.  ? ? ? ? ? ? ?Date of Service 06/09/2021  ? ? ?Erik Donovan ?[facility] ? ? ?If 7PM-7AM, please contact night-coverage ?[email or web address] ?06/09/2021, 6:08 PM ? ?

## 2021-06-09 NOTE — ED Provider Notes (Signed)
? ?[facility] ?Provider Note ? ? ? Event Date/Time  ? First MD Initiated Contact with Patient 06/09/21 1402   ?  (approximate) ? ? ?History  ? ?GI Bleeding ? ? ?HPI ? ?Erik Donovan is a 71 y.o. male who is otherwise healthy, presents emergency department with concerns of dark stools.  States they have been black for the last 2 days.  Patient does not take blood thinners.  Patient does take ibuprofen prior to working out, sometimes takes meloxicam.  Has not had any dizziness or weakness.  No chest pain/shortness of breath.  No abdominal pain. ? ?  ? ? ?Physical Exam  ? ?Triage Vital Signs: ?ED Triage Vitals [06/09/21 1352]  ?Enc Vitals Group  ?   BP (!) 167/95  ?   Pulse Rate 73  ?   Resp 18  ?   Temp   ?   Temp src   ?   SpO2 100 %  ?   Weight 195 lb (88.5 kg)  ?   Height '5\' 9"'$  (1.753 m)  ?   Head Circumference   ?   Peak Flow   ?   Pain Score 0  ?   Pain Loc   ?   Pain Edu?   ?   Excl. in [place]?   ? ? ?Most recent vital signs: ?Vitals:  ? 06/09/21 1352  ?BP: (!) 167/95  ?Pulse: 73  ?Resp: 18  ?SpO2: 100%  ? ? ? ?General: Awake, no distress.   ?CV:  Good peripheral perfusion. regular rate and  rhythm ?Resp:  Normal effort.  ?Abd:  No distention.   ?Other:  Rectal exam shows positive guaiac test ? ? ?ED Results / Procedures / Treatments  ? ?Labs ?(all labs ordered are listed, but only abnormal results are displayed) ?Labs Reviewed  ?COMPREHENSIVE METABOLIC PANEL - Abnormal; Notable for the following components:  ?    Result Value  ? Glucose, Bld 114 (*)   ? Total Bilirubin 1.6 (*)   ? All other components within normal limits  ?CBC WITH DIFFERENTIAL/PLATELET  ?PROTIME-INR  ?CBC  ?CBC  ?CBC  ?APTT  ?HIV ANTIBODY (ROUTINE TESTING W REFLEX)  ?TYPE AND SCREEN  ? ? ? ?EKG ? ? ? ? ?RADIOLOGY ? ? ? ? ?PROCEDURES: ? ? ?Procedures ? ? ?MEDICATIONS ORDERED IN ED: ?Medications  ?pantoprazole (PROTONIX) 80 mg /NS 100 mL IVPB (80 mg Intravenous New Bag/Given 06/09/21 1552)  ?pantoprozole (PROTONIX) 80 mg /NS 100  mL infusion (8 mg/hr Intravenous New Bag/Given 06/09/21 1552)  ?0.9 %  sodium chloride infusion (0 mLs Intravenous Hold 06/09/21 1552)  ?ondansetron (ZOFRAN) injection 4 mg (has no administration in time range)  ?acetaminophen (TYLENOL) tablet 650 mg (has no administration in time range)  ? ? ? ?IMPRESSION / MDM / ASSESSMENT AND PLAN / ED COURSE  ?I reviewed the triage vital signs and the nursing notes. ?             ?               ? ?Differential diagnosis includes, but is not limited to, GI bleed, anemia, symptomatic anemia ? ?Patient's guaiac test was positive. ? ?We will do basic labs.  Patient appears to be very stable this time.  Consider admission  ? ?CBC and metabolic panel are normal, INR is 1.1 which is normal ? ?We will start IV Protonix.  Due to the acute onset of upper GI bleed with black stools  and positive guaiac test will admit the patient for upper GI bleed. ? ?Consult hospitalist ? ?Hospitalist to admit.  Patient is in stable condition and agreeable to admission. ? ? ? ? ?  ? ? ?FINAL CLINICAL IMPRESSION(S) / ED DIAGNOSES  ? ?Final diagnoses:  ?Acute upper GI bleed  ? ? ? ?Rx / DC Orders  ? ?ED Discharge Orders   ? ? None  ? ?  ? ? ? ?Note:  This document was prepared using Dragon voice recognition software and may include unintentional dictation errors. ? ?  ?Versie Starks, PA-C ?06/09/21 1605 ? ?  ?Harvest Dark, MD ?06/10/21 1525 ? ?

## 2021-06-10 ENCOUNTER — Encounter
Admission: EM | Disposition: A | Discharge status: Home / Self Care | Payer: Self-pay | Source: Home / Self Care | Attending: Emergency Medicine

## 2021-06-10 ENCOUNTER — Observation Stay: Payer: PPO | Admitting: Anesthesiology

## 2021-06-10 ENCOUNTER — Encounter: Payer: Self-pay | Admitting: Internal Medicine

## 2021-06-10 DIAGNOSIS — K922 Gastrointestinal hemorrhage, unspecified: Secondary | ICD-10-CM | POA: Diagnosis not present

## 2021-06-10 DIAGNOSIS — K921 Melena: Secondary | ICD-10-CM | POA: Diagnosis not present

## 2021-06-10 DIAGNOSIS — E785 Hyperlipidemia, unspecified: Secondary | ICD-10-CM | POA: Diagnosis not present

## 2021-06-10 DIAGNOSIS — K269 Duodenal ulcer, unspecified as acute or chronic, without hemorrhage or perforation: Secondary | ICD-10-CM | POA: Diagnosis not present

## 2021-06-10 DIAGNOSIS — K222 Esophageal obstruction: Secondary | ICD-10-CM | POA: Diagnosis not present

## 2021-06-10 HISTORY — PX: ESOPHAGOGASTRODUODENOSCOPY (EGD) WITH PROPOFOL: SHX5813

## 2021-06-10 LAB — CBC
HCT: 38.9 % — ABNORMAL LOW (ref 39.0–52.0)
HCT: 40.2 % (ref 39.0–52.0)
Hemoglobin: 12.5 g/dL — ABNORMAL LOW (ref 13.0–17.0)
Hemoglobin: 13.3 g/dL (ref 13.0–17.0)
MCH: 28.5 pg (ref 26.0–34.0)
MCH: 29.4 pg (ref 26.0–34.0)
MCHC: 32.1 g/dL (ref 30.0–36.0)
MCHC: 33.1 g/dL (ref 30.0–36.0)
MCV: 88.7 fL (ref 80.0–100.0)
MCV: 88.8 fL (ref 80.0–100.0)
Platelets: 237 10*3/uL (ref 150–400)
Platelets: 247 10*3/uL (ref 150–400)
RBC: 4.38 MIL/uL (ref 4.22–5.81)
RBC: 4.53 MIL/uL (ref 4.22–5.81)
RDW: 13.4 % (ref 11.5–15.5)
RDW: 13.5 % (ref 11.5–15.5)
WBC: 6.5 10*3/uL (ref 4.0–10.5)
WBC: 7.5 10*3/uL (ref 4.0–10.5)
nRBC: 0 % (ref 0.0–0.2)
nRBC: 0 % (ref 0.0–0.2)

## 2021-06-10 LAB — HIV ANTIBODY (ROUTINE TESTING W REFLEX): HIV Screen 4th Generation wRfx: NONREACTIVE

## 2021-06-10 LAB — GLUCOSE, CAPILLARY
Glucose-Capillary: 87 mg/dL (ref 70–99)
Glucose-Capillary: 94 mg/dL (ref 70–99)

## 2021-06-10 SURGERY — ESOPHAGOGASTRODUODENOSCOPY (EGD) WITH PROPOFOL
Anesthesia: Monitor Anesthesia Care

## 2021-06-10 MED ORDER — PROPOFOL 10 MG/ML IV BOLUS
INTRAVENOUS | Status: DC | PRN
  Administered 2021-06-10: 30 mg via INTRAVENOUS
  Administered 2021-06-10: 100 mg via INTRAVENOUS

## 2021-06-10 MED ORDER — LIDOCAINE HCL (CARDIAC) PF 100 MG/5ML IV SOSY
PREFILLED_SYRINGE | INTRAVENOUS | Status: DC | PRN
  Administered 2021-06-10: 100 mg via INTRAVENOUS

## 2021-06-10 MED ORDER — PANTOPRAZOLE SODIUM 40 MG PO TBEC: 40.0000 mg | DELAYED_RELEASE_TABLET | Freq: Every day | ORAL | 0 refills | Status: AC

## 2021-06-10 NOTE — Anesthesia Preprocedure Evaluation (Signed)
Anesthesia Evaluation  ?Patient identified by MRN, date of birth, ID band ?Patient awake ? ?General Assessment Comment: ? ?Dark stools. Hemoglobin stable. ? ?Reviewed: ?Allergy & Precautions, NPO status , Patient's Chart, lab work & pertinent test results ? ?History of Anesthesia Complications ?Negative for: history of anesthetic complications ? ?Airway ?Mallampati: II ? ?TM Distance: >3 FB ?Neck ROM: Full ? ? ? Dental ?no notable dental hx. ?(+) Teeth Intact ?  ?Pulmonary ?neg pulmonary ROS, neg sleep apnea, neg COPD, Patient abstained from smoking.Not current smoker,  ?  ?Pulmonary exam normal ?breath sounds clear to auscultation ? ? ? ? ? ? Cardiovascular ?Exercise Tolerance: Good ?METS(-) hypertension(-) CAD and (-) Past MI negative cardio ROS ? ?(-) dysrhythmias  ?Rhythm:Regular Rate:Normal ?- Systolic murmurs ? ?  ?Neuro/Psych ?negative neurological ROS ? negative psych ROS  ? GI/Hepatic ?neg GERD  ,(+)  ?  ? (-) substance abuse ? ,   ?Endo/Other  ?neg diabetes ? Renal/GU ?negative Renal ROS  ? ?  ?Musculoskeletal ? ? Abdominal ?  ?Peds ? Hematology ?  ?Anesthesia Other Findings ?Past Medical History: ?No date: HLD (hyperlipidemia) ? Reproductive/Obstetrics ? ?  ? ? ? ? ? ? ? ? ? ? ? ? ? ?  ?  ? ? ? ? ? ? ? ? ?Anesthesia Physical ?Anesthesia Plan ? ?ASA: 1 ? ?Anesthesia Plan: General  ? ?Post-op Pain Management: Minimal or no pain anticipated  ? ?Induction: Intravenous ? ?PONV Risk Score and Plan: 2 and Propofol infusion, TIVA and Ondansetron ? ?Airway Management Planned: Nasal Cannula ? ?Additional Equipment: None ? ?Intra-op Plan:  ? ?Post-operative Plan:  ? ?Informed Consent: I have reviewed the patients History and Physical, chart, labs and discussed the procedure including the risks, benefits and alternatives for the proposed anesthesia with the patient or authorized representative who has indicated his/her understanding and acceptance.  ? ? ? ?Dental advisory  given ? ?Plan Discussed with: CRNA and Surgeon ? ?Anesthesia Plan Comments: (Discussed risks of anesthesia with patient, including possibility of difficulty with spontaneous ventilation under anesthesia necessitating airway intervention, PONV, and rare risks such as cardiac or respiratory or neurological events, and allergic reactions. Discussed the role of CRNA in patient's perioperative care. Patient understands.)  ? ? ? ? ? ? ?Anesthesia Quick Evaluation ? ?

## 2021-06-10 NOTE — Discharge Summary (Signed)
Physician Discharge Summary  ?Erik Donovan HKV:[id number] DOB: May 20, 1950 DOA: 06/09/2021 ? ?PCP: Kirk Ruths, MD ? ?Admit date: 06/09/2021 ?Discharge date: 06/10/2021 ? ?Admitted From: Home ?Disposition:  Home ? ?Recommendations for Outpatient Follow-up:  ?Follow up with PCP in 1-2 weeks ?Follow up with GI as needed ? ?Home Health: No ?Equipment/Devices: None ? ?Discharge Condition: Stable ?CODE STATUS: Full ?Diet recommendation: Regular ? ?Brief/Interim Summary: ?71 y.o. male with medical history significant of HLD, who presents with dark stool. ?  ?Pt states that he has black stool for the last 2 days, He  has had 3 episodes of dark stool bowel movement.  He states that he takes ibuprofen and Mobic intermittently for shoulder pain.  Denies nausea, vomiting or abdominal pain.  No chest pain, cough, shortness breath.  No dizziness or lightheadedness. ? ?Status post EGD 4/7.  No bleeding noted.  Cleared for discharge from GI standpoint.  No further inpatient needs.  Will discharge on empiric course of p.o. Protonix.  Avoid all NSAIDs.  Follow-up outpatient PCP.  Consider referral to GI for screening colonoscopy ? ? ? ?Discharge Diagnoses:  ?Principal Problem: ?  GI bleeding ?Active Problems: ?  HLD (hyperlipidemia) ?  Acute upper GI bleed ?  Melena ?  Duodenal ulceration ?  Stricture and stenosis of esophagus ? ?GI bleeding:  ?Suspect upper GI source.  Hemoglobin stable.  Patient is on ibuprofen and Mobic.  EGD performed.  Healing ulcer without visible vessel.  No active bleeding.  Patient did have mild esophageal stenosis.  Treated with balloon dilatation.  Cleared for discharge from GI standpoint.  At time of discharge discontinue all NSAIDs.  Start Protonix 40 mg p.o. daily x6 weeks.  Follow-up outpatient PCP.  Consider referral to GI for screening colonoscopy. ? ?  ?HLD (hyperlipidemia): not taking meds now ?-f/u with PCP ? ?Discharge Instructions ? ?Discharge Instructions   ? ? Diet - low sodium heart healthy    Complete by: As directed ?  ? Increase activity slowly   Complete by: As directed ?  ? ?  ? ?Allergies as of 06/10/2021   ?No Known Allergies ?  ? ?  ?Medication List  ?  ? ?STOP taking these medications   ? ?aspirin EC 81 MG tablet ?  ?meloxicam 15 MG tablet ?Commonly known as: MOBIC ?  ? ?  ? ?TAKE these medications   ? ?pantoprazole 40 MG tablet ?Commonly known as: Protonix ?Take 1 tablet (40 mg total) by mouth daily. ?  ? ?  ? ? Follow-up Information   ? ? Kirk Ruths, MD. Schedule an appointment as soon as possible for a visit in 1 week(s).   ?Specialty: Internal Medicine ?Contact information: ?[place] [address] ?[phone] ? ? ?  ?  ? ? Lucilla Lame, MD Follow up.   ?Specialty: Gastroenterology ?Why: Can followup with Dr. Allen Norris.  May need referral from your PCP ?Contact information: ?[place] ?[place]   [address] ?[phone] ? ? ?  ?  ? ?  ?  ? ?  ? ?No Known Allergies ? ?Consultations: ?GI ? ? ?Procedures/Studies: ?DG Shoulder Right ? ?Result Date: 05/18/2021 ?CLINICAL DATA:  Generalized right shoulder pain EXAM: RIGHT SHOULDER - 2+ VIEW COMPARISON:  None. FINDINGS: Frontal, transscapular, and axillary views of the right shoulder are obtained. No acute fracture, subluxation, or dislocation. 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 Moderate hypertrophic changes  are seen at the acromioclavicular joint. Visualized portions of the right chest are clear. IMPRESSION: 1. Significant glenohumeral and acromioclavicular joint osteoarthritis. 2. No acute fracture. Electronically Signed   By: Randa Ngo M.D.   On: 05/18/2021 15:38  ? ?[place] RT LOWER EXTREM LTD SOFT TISSUE NON VASCULAR ? ?Result Date: 05/18/2021 ?CLINICAL DATA:  Sprain right quadriceps. Question tendon injury/hematoma/muscle tear EXAM: ULTRASOUND RIGHT LOWER EXTREMITY LIMITED TECHNIQUE: Ultrasound examination of the lower extremity  soft tissues was performed in the area of clinical concern. COMPARISON:  None. FINDINGS: Joint Space: No effusion. Muscles: Normal. Tendons: Normal Other Soft Tissue Structures: No evidence of hematoma. Overlying the greater trochanter, there is a small fluid collection measuring 1.5 x 1.4 x 0.4 cm. This could reflect fluid within a bursa and bursitis. IMPRESSION: No visible hematoma. Small fluid collection overlying the greater trochanter, question bursal fluid/bursitis. Electronically Signed   By: Rolm Baptise M.D.   On: 05/18/2021 17:32  ? ?DG HIP UNILAT WITH PELVIS 2-3 VIEWS RIGHT ? ?Result Date: 05/18/2021 ?CLINICAL DATA:  Right hip pain. No known injury. Felt pain in right thigh doing leg press workout. EXAM: DG HIP (WITH OR WITHOUT PELVIS) 2-3V RIGHT COMPARISON:  Pelvis and left hip radiographs 03/23/2016 FINDINGS: Normal bone mineralization. Minimal superior pubic symphysis joint space narrowing. Otherwise, joint spaces are maintained. No acute fracture is seen. No dislocation. IMPRESSION: No significant right hip osteoarthritis. Electronically Signed   By: Yvonne Kendall M.D.   On: 05/18/2021 15:41   ? ? ? ?Subjective: ?Seen and examined on day of discharge.  Stable no distress.  Stable for discharge home. ? ?Discharge Exam: ?Vitals:  ? 06/10/21 1326 06/10/21 1338  ?BP: 127/78 127/78  ?Pulse: 63   ?Resp: 19 20  ?Temp:    ?SpO2: 98%   ? ?Vitals:  ? 06/10/21 6812 06/10/21 1316 06/10/21 1326 06/10/21 1338  ?BP: 121/68 (!) 120/46 127/78 127/78  ?Pulse: (!) 53 (!) 51 63   ?Resp: '18 18 19 20  '$ ?Temp: 98.4 ?F (36.9 ?C) (!) 97.3 ?F (36.3 ?C)    ?TempSrc:  Temporal    ?SpO2: 96%  98%   ?Weight:      ?Height:      ? ? ?General: Pt is alert, awake, not in acute distress ?Cardiovascular: RRR, S1/S2 +, no rubs, no gallops ?Respiratory: CTA bilaterally, no wheezing, no rhonchi ?Abdominal: Soft, NT, ND, bowel sounds + ?Extremities: no edema, no cyanosis ? ? ? ?The results of significant diagnostics from this hospitalization  (including imaging, microbiology, ancillary and laboratory) are listed below for reference.   ? ? ?Microbiology: ?No results found for this or any previous visit (from the past 240 hour(s)).  ? ?Labs: ?BNP (last 3 results) ?No results for input(s): BNP in the last 8760 hours. ?Basic Metabolic Panel: ?Recent Labs  ?Lab 06/09/21 ?1430  ?NA 138  ?K 4.0  ?CL 105  ?CO2 23  ?GLUCOSE 114*  ?BUN 18  ?CREATININE 1.05  ?CALCIUM 9.3  ? ?Liver Function Tests: ?Recent Labs  ?Lab 06/09/21 ?1430  ?AST 34  ?ALT 19  ?ALKPHOS 63  ?BILITOT 1.6*  ?PROT 7.5  ?ALBUMIN 4.0  ? ?No results for input(s): LIPASE, AMYLASE in the last 168 hours. ?No results for input(s): AMMONIA in the last 168 hours. ?CBC: ?Recent Labs  ?Lab 06/09/21 ?1430 06/09/21 ?1547 06/09/21 ?2356 06/10/21 ?0615  ?WBC 7.1 7.5 7.5 6.5  ?NEUTROABS 4.5  --   --   --   ?HGB 13.2 13.2 13.3 12.5*  ?HCT  40.4 40.1 40.2 38.9*  ?MCV 88.4 88.1 88.7 88.8  ?PLT 253 263 247 237  ? ?Cardiac Enzymes: ?No results for input(s): CKTOTAL, CKMB, CKMBINDEX, TROPONINI in the last 168 hours. ?BNP: ?Invalid input(s): POCBNP ?CBG: ?Recent Labs  ?Lab 06/10/21 ?4332 06/10/21 ?0815  ?GLUCAP 87 94  ? ?D-Dimer ?No results for input(s): DDIMER in the last 72 hours. ?Hgb A1c ?No results for input(s): HGBA1C in the last 72 hours. ?Lipid Profile ?No results for input(s): CHOL, HDL, LDLCALC, TRIG, CHOLHDL, LDLDIRECT in the last 72 hours. ?Thyroid function studies ?No results for input(s): TSH, T4TOTAL, T3FREE, THYROIDAB in the last 72 hours. ? ?Invalid input(s): FREET3 ?Anemia work up ?No results for input(s): VITAMINB12, FOLATE, FERRITIN, TIBC, IRON, RETICCTPCT in the last 72 hours. ?Urinalysis ?No results found for: COLORURINE, APPEARANCEUR, [place], [place], [place], [place], [place], KETONESUR, PROTEINUR, [place], NITRITE, LEUKOCYTESUR ?Sepsis Labs ?Invalid input(s): PROCALCITONIN,  WBC,  LACTICIDVEN ?Microbiology ?No results found for this or any previous visit (from the past 240  hour(s)). ? ? ?Time coordinating discharge: Over 30 minutes ? ?SIGNED: ? ? ?Sidney Ace, MD  ?[facility] ?06/10/2021, 2:27 PM ?Pager  ? ?If 7PM-7AM, please contact night-coverage ? ?

## 2021-06-10 NOTE — Plan of Care (Signed)
  Problem: Education: Goal: Knowledge of General Education information will improve Description Including pain rating scale, medication(s)/side effects and non-pharmacologic comfort measures Outcome: Progressing   

## 2021-06-10 NOTE — Progress Notes (Signed)
Brief progress note.  Full note to follow. ? ?71 year old male with history of hyperlipidemia presents with dark stools over the past 2 to 3 days.  Patient does take ibuprofen and Mobic.  Suspect NSAID related GI bleed.  Patient hemodynamically stable.  Hemoglobin 12.5.  GI consulted on admission.  Likely plan for EGD today.  Patient denies any history of endoscopic evaluation either EGD or colonoscopy.  May require colonoscopy depending on results of EGD.  Defer to GI. ? ?Continue Protonix gtt. ?Await GI evaluation and formal recommendations ?Continue n.p.o. for now ? ?Ralene Muskrat MD ? ?

## 2021-06-10 NOTE — Progress Notes (Signed)
Mobility Specialist - Progress Note ? ? 06/10/21 1253  ?Mobility  ?Activity Ambulated independently in hallway;Stood at bedside;Ambulated independently to bathroom;Dangled on edge of bed  ?Level of Assistance Independent  ?Assistive Device None  ?Distance Ambulated (ft) 320 ft  ?Activity Response Tolerated well  ?$Mobility charge 1 Mobility  ? ? ?During mobility: 79 HR,97% SpO2 ? ?Pt supine upon arrival using RA. Pt completes bed mobility to sit EOB ModI and STS indep. Pt ambulates 381f indep voicing no complaints and returns to room for indep ambulation to bathroom. Pt is left with needs in reach. ? ?MMerrily Brittle?Mobility Specialist ?06/10/21, 12:56 PM ? ? ? ? ?

## 2021-06-10 NOTE — Progress Notes (Signed)
Patient discharged via self/private transfer. AVS reviewed with medication instructions included. Patient expressed understanding and had no further questions.Patient PIVX1 removed with catheter intact.  ?

## 2021-06-10 NOTE — Anesthesia Postprocedure Evaluation (Signed)
Anesthesia Post Note ? ?Patient: Erik Donovan ? ?Procedure(s) Performed: ESOPHAGOGASTRODUODENOSCOPY (EGD) WITH PROPOFOL ? ?Patient location during evaluation: PACU ?Anesthesia Type: MAC and General ?Level of consciousness: awake and oriented ?Pain management: pain level controlled ?Vital Signs Assessment: post-procedure vital signs reviewed and stable ?Respiratory status: spontaneous breathing and respiratory function stable ?Cardiovascular status: stable ?Anesthetic complications: no ? ? ?No notable events documented. ? ? ?Last Vitals:  ?Vitals:  ? 06/10/21 1326 06/10/21 1338  ?BP: 127/78 127/78  ?Pulse: 63   ?Resp: 19 20  ?Temp:    ?SpO2: 98%   ?  ?Last Pain:  ?Vitals:  ? 06/10/21 1326  ?TempSrc:   ?PainSc: 0-No pain  ? ? ?  ?  ?  ?  ?  ?  ? ?VAN STAVEREN,Camar Guyton ? ? ? ? ?

## 2021-06-10 NOTE — Op Note (Signed)
[facility] ?Gastroenterology ?Patient Name: Erik Donovan ?Procedure Date: 06/10/2021 12:30 PM ?MRN: [id number] ?Account #: [id number] ?Date of Birth: 03-Mar-1951 ?Admit Type: Inpatient ?Age: 71 ?Room: [facility] ENDO ROOM 4 ?Gender: Male ?Note Status: Finalized ?Instrument Name: Upper Endoscope [id number] ?Procedure:             Upper GI endoscopy ?Indications:           Melena ?Providers:             Lucilla Lame MD, MD ?Referring MD:          Ocie Cornfield. Ouida Sills MD, MD (Referring MD) ?Medicines:             Propofol per Anesthesia ?Complications:         No immediate complications. ?Procedure:             Pre-Anesthesia Assessment: ?                       - Prior to the procedure, a History and Physical was  ?                       performed, and patient medications and allergies were  ?                       reviewed. The patient's tolerance of previous  ?                       anesthesia was also reviewed. The risks and benefits  ?                       of the procedure and the sedation options and risks  ?                       were discussed with the patient. All questions were  ?                       answered, and informed consent was obtained. Prior  ?                       Anticoagulants: The patient has taken no previous  ?                       anticoagulant or antiplatelet agents. ASA Grade  ?                       Assessment: II - A patient with mild systemic disease.  ?                       After reviewing the risks and benefits, the patient  ?                       was deemed in satisfactory condition to undergo the  ?                       procedure. ?                       After obtaining informed consent, the endoscope was  ?  passed under direct vision. Throughout the procedure,  ?                       the patient's blood pressure, pulse, and oxygen  ?                       saturations were monitored continuously. The Endoscope  ?                       was introduced  through the mouth, and advanced to the  ?                       second part of duodenum. The upper GI endoscopy was  ?                       accomplished without difficulty. The patient tolerated  ?                       the procedure well. ?Findings: ?     One benign-appearing, intrinsic moderate stenosis was found at the  ?     gastroesophageal junction. The stenosis was traversed. A TTS dilator was  ?     passed through the scope. Dilation with a 12-13.5-15 mm balloon dilator  ?     was performed to 15 mm. ?     The stomach was normal. Biopsies were taken with a cold forceps for  ?     Helicobacter pylori testing. ?     One non-bleeding cratered duodenal ulcer with no stigmata of bleeding  ?     was found in the first portion of the duodenum. ?Impression:            - Benign-appearing esophageal stenosis. Dilated. ?                       - Normal stomach. Biopsied. ?                       - Non-bleeding duodenal ulcer with no stigmata of  ?                       bleeding. ?Recommendation:        - Return patient to hospital ward for ongoing care. ?                       - Resume previous diet. ?                       - Continue present medications. ?                       - Await pathology results. ?Procedure Code(s):     --- Professional --- ?                       [phone], Esophagogastroduodenoscopy, flexible,  ?                       transoral; with transendoscopic balloon dilation of  ?                       esophagus (less than 30 mm diameter) ?  43239, 59, Esophagogastroduodenoscopy, flexible,  ?                       transoral; with biopsy, single or multiple ?Diagnosis Code(s):     --- Professional --- ?                       K92.1, Melena (includes Hematochezia) ?                       K26.9, Duodenal ulcer, unspecified as acute or  ?                       chronic, without hemorrhage or perforation ?                       K22.2, Esophageal obstruction ?CPT copyright 2019 American  Medical Association. All rights reserved. ?The codes documented in this report are preliminary and upon coder review may  ?be revised to meet current compliance requirements. ?Lucilla Lame MD, MD ?06/10/2021 1:15:06 PM ?This report has been signed electronically. ?Number of Addenda: 0 ?Note Initiated On: 06/10/2021 12:30 PM ?Estimated Blood Loss:  Estimated blood loss: none. ?     [facility] ?

## 2021-06-10 NOTE — Transfer of Care (Signed)
Immediate Anesthesia Transfer of Care Note ? ?Patient: Erik Donovan ? ?Procedure(s) Performed: ESOPHAGOGASTRODUODENOSCOPY (EGD) WITH PROPOFOL ? ?Patient Location: Endoscopy Unit ? ?Anesthesia Type:MAC ? ?Level of Consciousness: drowsy ? ?Airway & Oxygen Therapy: Patient Spontanous Breathing and Patient connected to nasal cannula oxygen ? ?Post-op Assessment: Report given to RN, Post -op Vital signs reviewed and stable and Patient moving all extremities ? ?Post vital signs: stable ? ?Last Vitals:  ?Vitals Value Taken Time  ?BP    ?Temp 36.3 ?C 06/10/21 1316  ?Pulse 51 06/10/21 1318  ?Resp 19 06/10/21 1318  ?SpO2 96 % 06/10/21 1318  ?Vitals shown include unvalidated device data. ? ?Last Pain:  ?Vitals:  ? 06/10/21 1316  ?TempSrc: Temporal  ?PainSc: Asleep  ?   ? ?Patients Stated Pain Goal: 0 (06/10/21 0900) ? ?Complications: No notable events documented. ?

## 2021-06-10 NOTE — Plan of Care (Signed)
?  Problem: Education: ?Goal: Knowledge of General Education information will improve ?Description: Including pain rating scale, medication(s)/side effects and non-pharmacologic comfort measures ?06/10/2021 1558 by Evelena Peat, RN ?Outcome: Completed/Met ?06/10/2021 1021 by Evelena Peat, RN ?Outcome: Progressing ?  ?

## 2021-06-13 ENCOUNTER — Encounter: Payer: Self-pay | Admitting: Gastroenterology

## 2021-06-13 LAB — SURGICAL PATHOLOGY

## 2021-06-15 DIAGNOSIS — K922 Gastrointestinal hemorrhage, unspecified: Secondary | ICD-10-CM | POA: Diagnosis not present

## 2021-06-15 DIAGNOSIS — R030 Elevated blood-pressure reading, without diagnosis of hypertension: Secondary | ICD-10-CM | POA: Diagnosis not present

## 2021-06-17 ENCOUNTER — Telehealth: Payer: Self-pay

## 2021-06-17 ENCOUNTER — Telehealth: Payer: Self-pay | Admitting: Gastroenterology

## 2021-06-17 NOTE — Telephone Encounter (Signed)
I spoke to pt and he stated that he has seen his PCP and does not have anyone to bring him for colonoscopy so he will not be able to do at this time... Also pt said he would call back to schedule appt if he felt he needed to ?

## 2021-06-17 NOTE — Telephone Encounter (Signed)
Pt had endoscopy on 04/12 has been having difficulties since procedure with swallowing  would like a call back with suggestions ?

## 2021-06-17 NOTE — Telephone Encounter (Signed)
CALLED PATIENT NO ANSWER LEFT VOICEMAIL FOR A CALL BACK ? ?

## 2021-06-24 NOTE — Telephone Encounter (Signed)
Patient returned your call, requesting a call back. Patient also mentioned that he does not have any family or friends to come with him to the colonoscopy but that he really needs this colonoscopy because he is having a lot of issues.  ? ?I told the patient about "[facility]" for senior citizens.  ?

## 2021-06-28 ENCOUNTER — Telehealth: Payer: Self-pay

## 2021-06-28 DIAGNOSIS — K250 Acute gastric ulcer with hemorrhage: Secondary | ICD-10-CM | POA: Diagnosis not present

## 2021-06-28 NOTE — Telephone Encounter (Signed)
CALLED PATIENT NO ANSWER LEFT VOICEMAIL FOR A CALL BACK ? ?

## 2021-07-06 ENCOUNTER — Emergency Department
Admission: EM | Admit: 2021-07-06 | Discharge: 2021-07-06 | Disposition: A | Discharge status: Home / Self Care | Payer: PPO | Attending: Emergency Medicine | Admitting: Emergency Medicine

## 2021-07-06 ENCOUNTER — Encounter (HOSPITAL_COMMUNITY): Payer: Self-pay

## 2021-07-06 ENCOUNTER — Emergency Department (HOSPITAL_COMMUNITY): Payer: PPO

## 2021-07-06 ENCOUNTER — Emergency Department (HOSPITAL_COMMUNITY)
Admission: EM | Admit: 2021-07-06 | Discharge: 2021-07-06 | Discharge status: Against Medical Advice | Payer: PPO | Attending: Emergency Medicine | Admitting: Emergency Medicine

## 2021-07-06 DIAGNOSIS — I700 Atherosclerosis of aorta: Secondary | ICD-10-CM | POA: Diagnosis not present

## 2021-07-06 DIAGNOSIS — Z008 Encounter for other general examination: Secondary | ICD-10-CM | POA: Insufficient documentation

## 2021-07-06 DIAGNOSIS — R140 Abdominal distension (gaseous): Secondary | ICD-10-CM | POA: Insufficient documentation

## 2021-07-06 DIAGNOSIS — Z5321 Procedure and treatment not carried out due to patient leaving prior to being seen by health care provider: Secondary | ICD-10-CM | POA: Diagnosis not present

## 2021-07-06 DIAGNOSIS — M47814 Spondylosis without myelopathy or radiculopathy, thoracic region: Secondary | ICD-10-CM | POA: Diagnosis not present

## 2021-07-06 DIAGNOSIS — M47816 Spondylosis without myelopathy or radiculopathy, lumbar region: Secondary | ICD-10-CM | POA: Diagnosis not present

## 2021-07-06 LAB — BASIC METABOLIC PANEL
Anion gap: 7 (ref 5–15)
BUN: 15 mg/dL (ref 8–23)
CO2: 25 mmol/L (ref 22–32)
Calcium: 9.6 mg/dL (ref 8.9–10.3)
Chloride: 106 mmol/L (ref 98–111)
Creatinine, Ser: 1.1 mg/dL (ref 0.61–1.24)
GFR, Estimated: >60 mL/min (ref >60)
Glucose, Bld: 106 mg/dL — ABNORMAL HIGH (ref 70–99)
Potassium: 4.1 mmol/L (ref 3.5–5.1)
Sodium: 138 mmol/L (ref 135–145)

## 2021-07-06 LAB — CBC WITH DIFFERENTIAL/PLATELET
Abs Immature Granulocytes: 0.01 10*3/uL (ref 0.00–0.07)
Basophils Absolute: 0.1 10*3/uL (ref 0.0–0.1)
Basophils Relative: 1 %
Eosinophils Absolute: 0.2 10*3/uL (ref 0.0–0.5)
Eosinophils Relative: 4 %
HCT: 42.4 % (ref 39.0–52.0)
Hemoglobin: 14.1 g/dL (ref 13.0–17.0)
Immature Granulocytes: 0 %
Lymphocytes Relative: 26 %
Lymphs Abs: 1.3 10*3/uL (ref 0.7–4.0)
MCH: 29.7 pg (ref 26.0–34.0)
MCHC: 33.3 g/dL (ref 30.0–36.0)
MCV: 89.3 fL (ref 80.0–100.0)
Monocytes Absolute: 0.4 10*3/uL (ref 0.1–1.0)
Monocytes Relative: 8 %
Neutro Abs: 3.2 10*3/uL (ref 1.7–7.7)
Neutrophils Relative %: 61 %
Platelets: 257 10*3/uL (ref 150–400)
RBC: 4.75 MIL/uL (ref 4.22–5.81)
RDW: 13.4 % (ref 11.5–15.5)
WBC: 5.2 10*3/uL (ref 4.0–10.5)
nRBC: 0 % (ref 0.0–0.2)

## 2021-07-06 NOTE — ED Provider Triage Note (Signed)
Emergency Medicine Provider Triage Evaluation Note ? ?Erik Donovan , a 71 y.o. male  was evaluated in triage.  Pt complains of constant gas since his endoscopy on 4/7.  Says it is unbearable.  Denies any abdominal pain, nausea, vomiting, diarrhea. ? ?Review of Systems  ?Positive: Gas ?Negative: Nausea, vomiting, diarrhea ? ?Physical Exam  ?BP (!) 169/102 (BP Location: Left Arm)   Pulse 64   Temp 97.9 ?F (36.6 ?C) (Oral)   Resp 18   Ht '5\' 9"'$  (1.753 m)   Wt 86.2 kg   SpO2 100%   BMI 28.06 kg/m?  ?Gen:   Awake, no distress   ?Resp:  Normal effort  ?MSK:   Moves extremities without difficulty  ?Other:  Nontender abdomen ? ?Medical Decision Making  ?Medically screening exam initiated at 10:05 AM.  Appropriate orders placed.  Erik Donovan was informed that the remainder of the evaluation will be completed by another provider, this initial triage assessment does not replace that evaluation, and the importance of remaining in the ED until their evaluation is complete. ? ? ?  ?Rhae Hammock, PA-C ?07/06/21 1014 ? ?

## 2021-07-06 NOTE — ED Notes (Signed)
No response to last call ?

## 2021-07-06 NOTE — ED Notes (Signed)
No response x2 ?

## 2021-07-06 NOTE — ED Notes (Addendum)
Pt states he is not going to wait hours to see a doctor.  Just left [facility] for same reason.  Pt states hes going to leave.  Refused bloodwork. Pt advised to return if sx change or worsen.  Pt ambulated out of ED.   ?

## 2021-07-06 NOTE — ED Triage Notes (Signed)
Pt reports that he had an upper GI endoscopy on 4/7 and has had bloating since. Pt states that at times he feels abdominal discomfort described as "feeling full".  ?

## 2021-07-11 DIAGNOSIS — K279 Peptic ulcer, site unspecified, unspecified as acute or chronic, without hemorrhage or perforation: Secondary | ICD-10-CM | POA: Diagnosis not present

## 2021-07-19 DIAGNOSIS — Z125 Encounter for screening for malignant neoplasm of prostate: Secondary | ICD-10-CM | POA: Diagnosis not present

## 2021-07-19 DIAGNOSIS — Z0000 Encounter for general adult medical examination without abnormal findings: Secondary | ICD-10-CM | POA: Diagnosis not present

## 2021-07-19 DIAGNOSIS — R7303 Prediabetes: Secondary | ICD-10-CM | POA: Diagnosis not present

## 2021-07-19 DIAGNOSIS — E7800 Pure hypercholesterolemia, unspecified: Secondary | ICD-10-CM | POA: Diagnosis not present

## 2021-07-26 DIAGNOSIS — R7303 Prediabetes: Secondary | ICD-10-CM | POA: Diagnosis not present

## 2021-07-26 DIAGNOSIS — Z0000 Encounter for general adult medical examination without abnormal findings: Secondary | ICD-10-CM | POA: Diagnosis not present

## 2021-07-26 DIAGNOSIS — E7800 Pure hypercholesterolemia, unspecified: Secondary | ICD-10-CM | POA: Diagnosis not present

## 2021-07-26 DIAGNOSIS — R030 Elevated blood-pressure reading, without diagnosis of hypertension: Secondary | ICD-10-CM | POA: Diagnosis not present

## 2021-07-28 DIAGNOSIS — H524 Presbyopia: Secondary | ICD-10-CM | POA: Diagnosis not present

## 2021-08-05 DIAGNOSIS — M7581 Other shoulder lesions, right shoulder: Secondary | ICD-10-CM | POA: Diagnosis not present

## 2021-08-05 DIAGNOSIS — M19011 Primary osteoarthritis, right shoulder: Secondary | ICD-10-CM | POA: Diagnosis not present

## 2021-10-24 DIAGNOSIS — D485 Neoplasm of uncertain behavior of skin: Secondary | ICD-10-CM | POA: Diagnosis not present

## 2021-10-24 DIAGNOSIS — L821 Other seborrheic keratosis: Secondary | ICD-10-CM | POA: Diagnosis not present

## 2022-01-31 DIAGNOSIS — R7303 Prediabetes: Secondary | ICD-10-CM | POA: Diagnosis not present

## 2022-01-31 DIAGNOSIS — E7800 Pure hypercholesterolemia, unspecified: Secondary | ICD-10-CM | POA: Diagnosis not present

## 2022-01-31 DIAGNOSIS — R030 Elevated blood-pressure reading, without diagnosis of hypertension: Secondary | ICD-10-CM | POA: Diagnosis not present

## 2022-07-27 DIAGNOSIS — E7800 Pure hypercholesterolemia, unspecified: Secondary | ICD-10-CM | POA: Diagnosis not present

## 2022-07-27 DIAGNOSIS — R7303 Prediabetes: Secondary | ICD-10-CM | POA: Diagnosis not present

## 2022-07-27 DIAGNOSIS — Z125 Encounter for screening for malignant neoplasm of prostate: Secondary | ICD-10-CM | POA: Diagnosis not present

## 2022-07-27 DIAGNOSIS — R030 Elevated blood-pressure reading, without diagnosis of hypertension: Secondary | ICD-10-CM | POA: Diagnosis not present

## 2022-08-02 DIAGNOSIS — R7303 Prediabetes: Secondary | ICD-10-CM | POA: Diagnosis not present

## 2022-08-02 DIAGNOSIS — Z0000 Encounter for general adult medical examination without abnormal findings: Secondary | ICD-10-CM | POA: Diagnosis not present

## 2022-08-02 DIAGNOSIS — E7800 Pure hypercholesterolemia, unspecified: Secondary | ICD-10-CM | POA: Diagnosis not present

## 2022-10-04 IMAGING — US US EXTREM LOW*R* LIMITED
1 series · 14 of 25 positions shown · non-contrast
Comparison: None.

CLINICAL DATA: Sprain right quadriceps. Question tendon
injury/hematoma/muscle tear

EXAM:
ULTRASOUND RIGHT LOWER EXTREMITY LIMITED
TECHNIQUE: Ultrasound examination of the lower extremity soft tissues was
performed in the area of clinical concern.

[Series 1: us soft tissue lower extremity limited right (non- · 27 acquisitions, 14 frames shown]
[im 1/27]
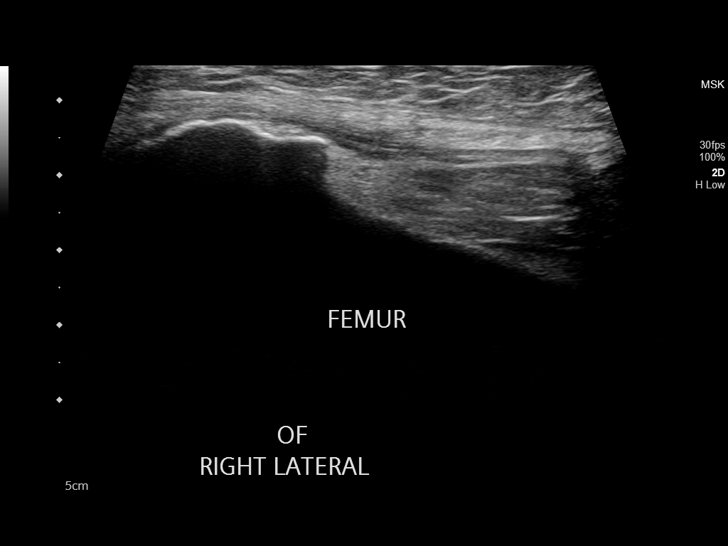
[im 3/27]
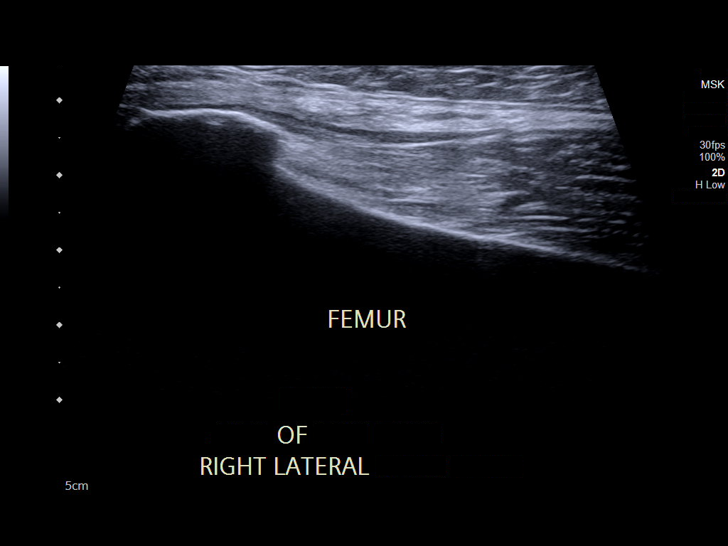
[im 5/27]
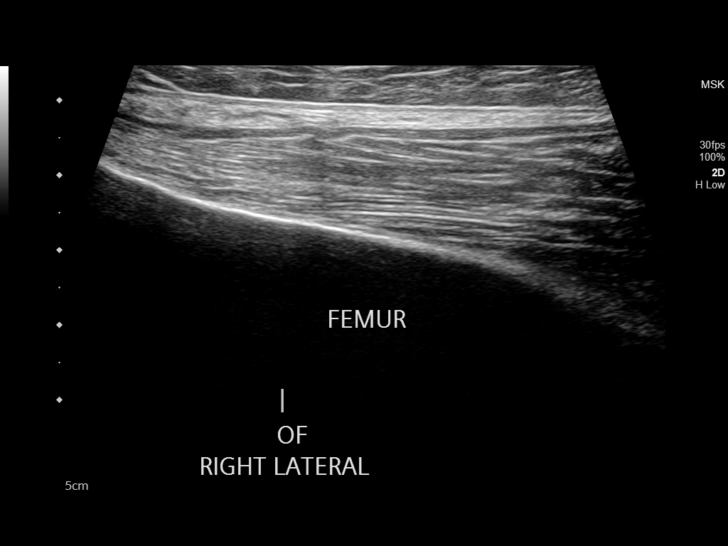
[im 7/27]
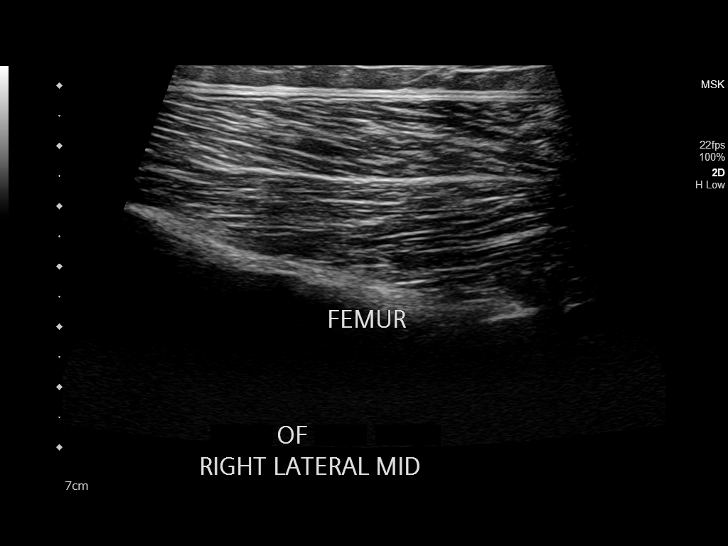
[im 9/27]
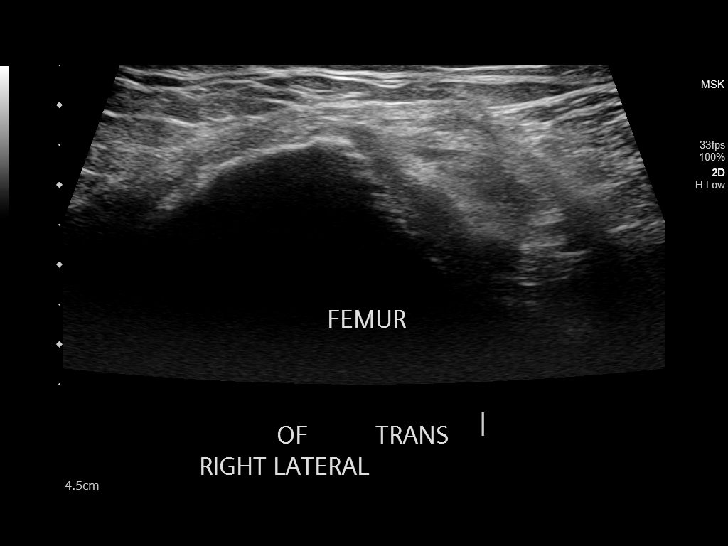
[im 10/27]
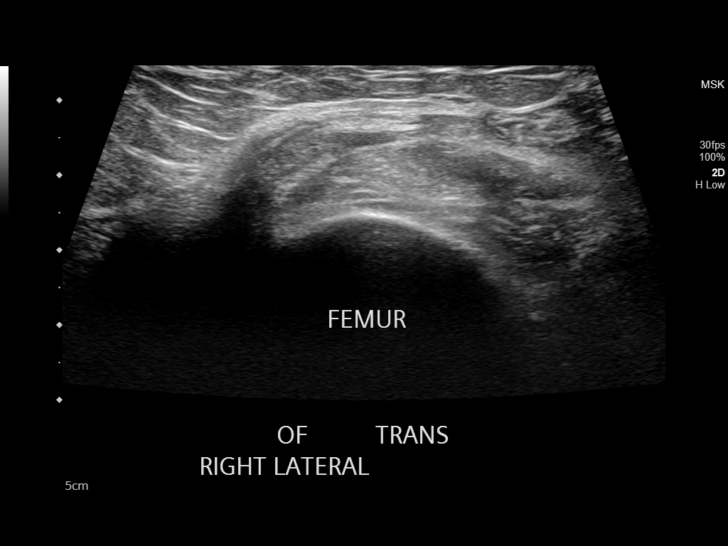
[im 12/27]
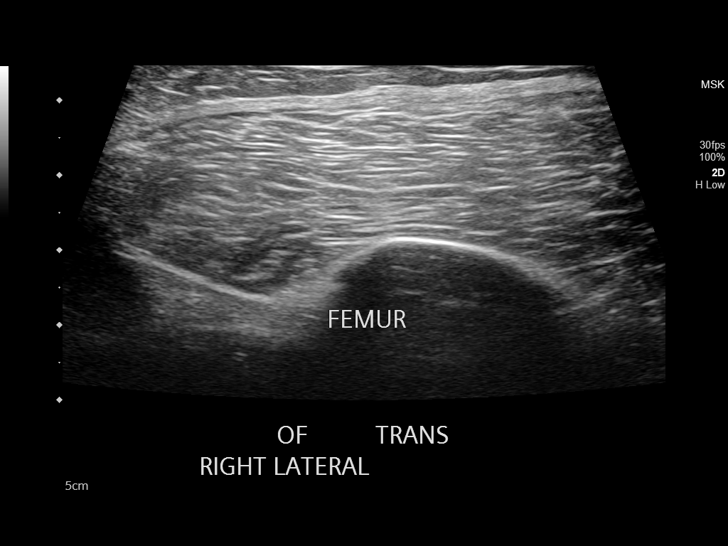
[im 15/27]
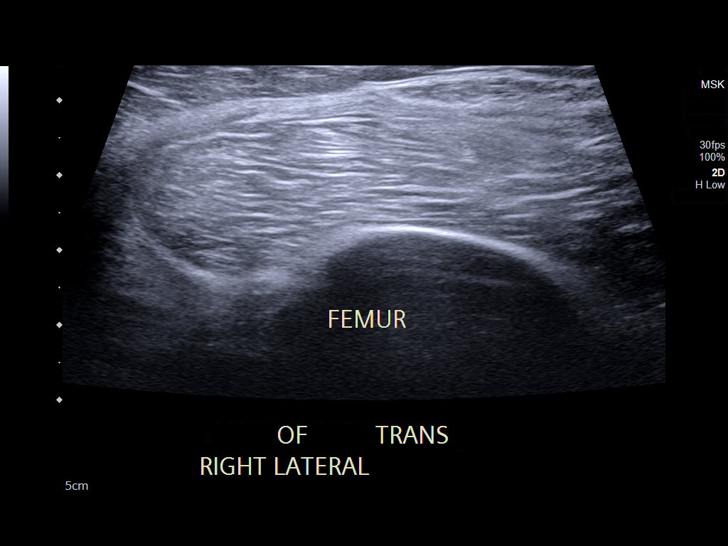
[im 17/27]
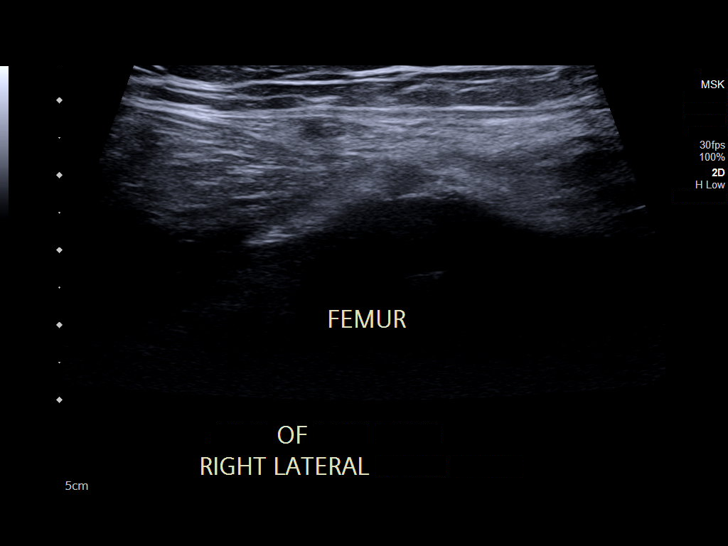
[im 18/27]
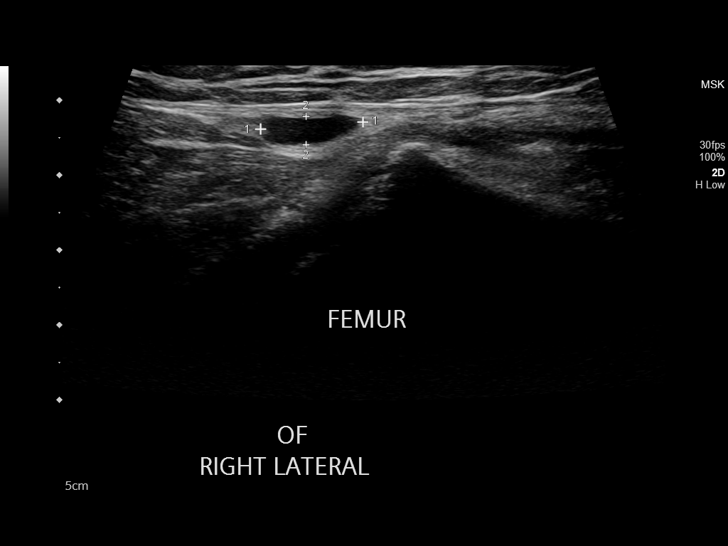
[im 20/27]
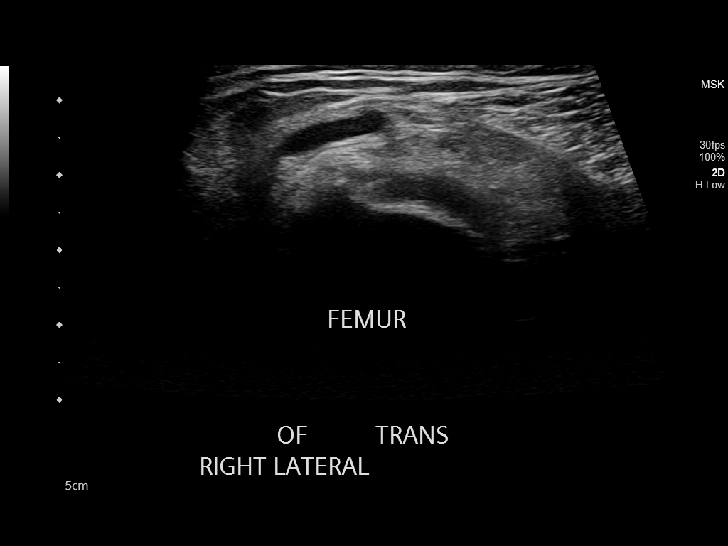
[im 22/27]
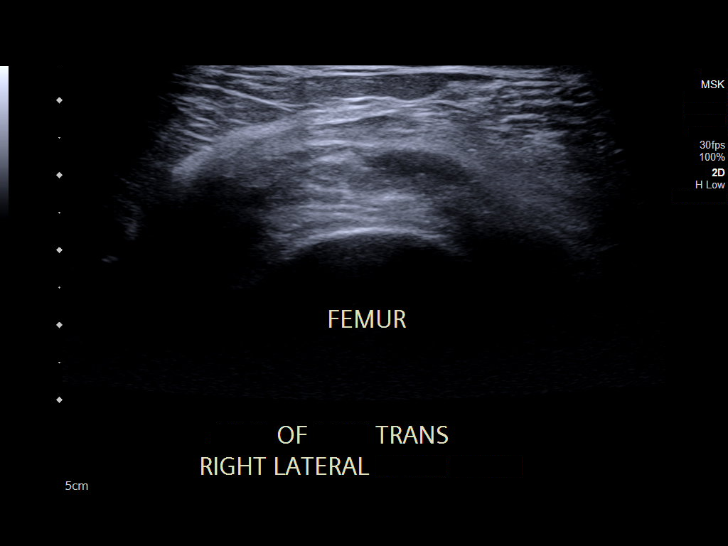
[im 24/27]
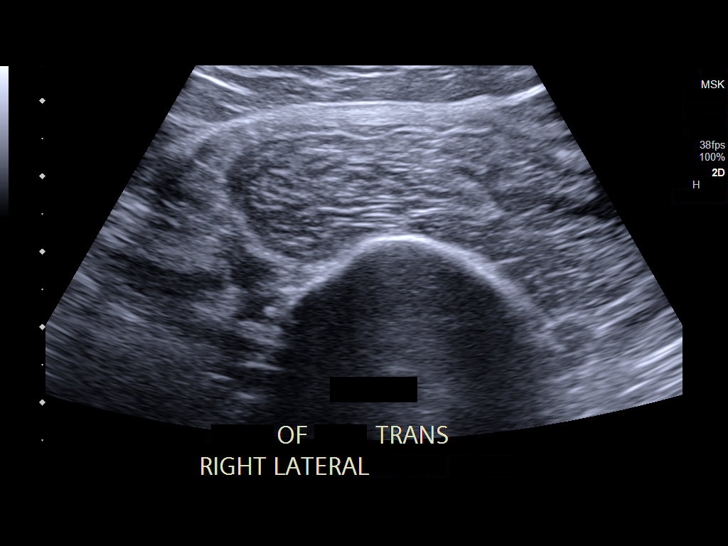
[im 27/27]
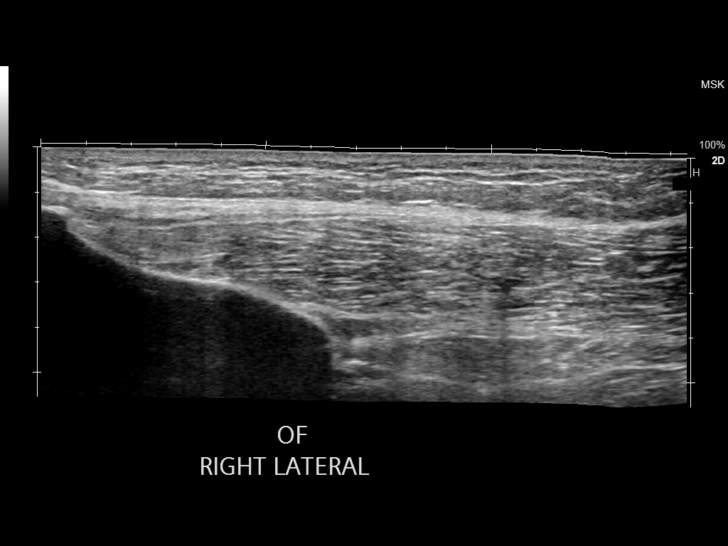

[14 of 25 positions shown; findings below may reference images not displayed]

FINDINGS: Joint Space: No effusion.

Muscles: Normal.

Tendons: Normal

Other Soft Tissue Structures: No evidence of hematoma. Overlying the
greater trochanter, there is a small fluid collection measuring
x 1.4 x 0.4 cm. This could reflect fluid within a bursa and
bursitis.
IMPRESSION: No visible hematoma.

Small fluid collection overlying the greater trochanter, question
bursal fluid/bursitis.

## 2022-10-31 DIAGNOSIS — E7800 Pure hypercholesterolemia, unspecified: Secondary | ICD-10-CM | POA: Diagnosis not present

## 2022-10-31 DIAGNOSIS — Z1211 Encounter for screening for malignant neoplasm of colon: Secondary | ICD-10-CM | POA: Diagnosis not present

## 2022-10-31 DIAGNOSIS — R030 Elevated blood-pressure reading, without diagnosis of hypertension: Secondary | ICD-10-CM | POA: Diagnosis not present

## 2022-10-31 DIAGNOSIS — R7303 Prediabetes: Secondary | ICD-10-CM | POA: Diagnosis not present

## 2022-11-02 DIAGNOSIS — Z1211 Encounter for screening for malignant neoplasm of colon: Secondary | ICD-10-CM | POA: Diagnosis not present

## 2022-11-22 IMAGING — CR DG ABDOMEN 1V
2 series · 2 of 2 positions shown · non-contrast
Comparison: 05/18/2021

CLINICAL DATA: Gas problems

EXAM:
ABDOMEN - 1 VIEW

[abdomen kub (1 of 2)]
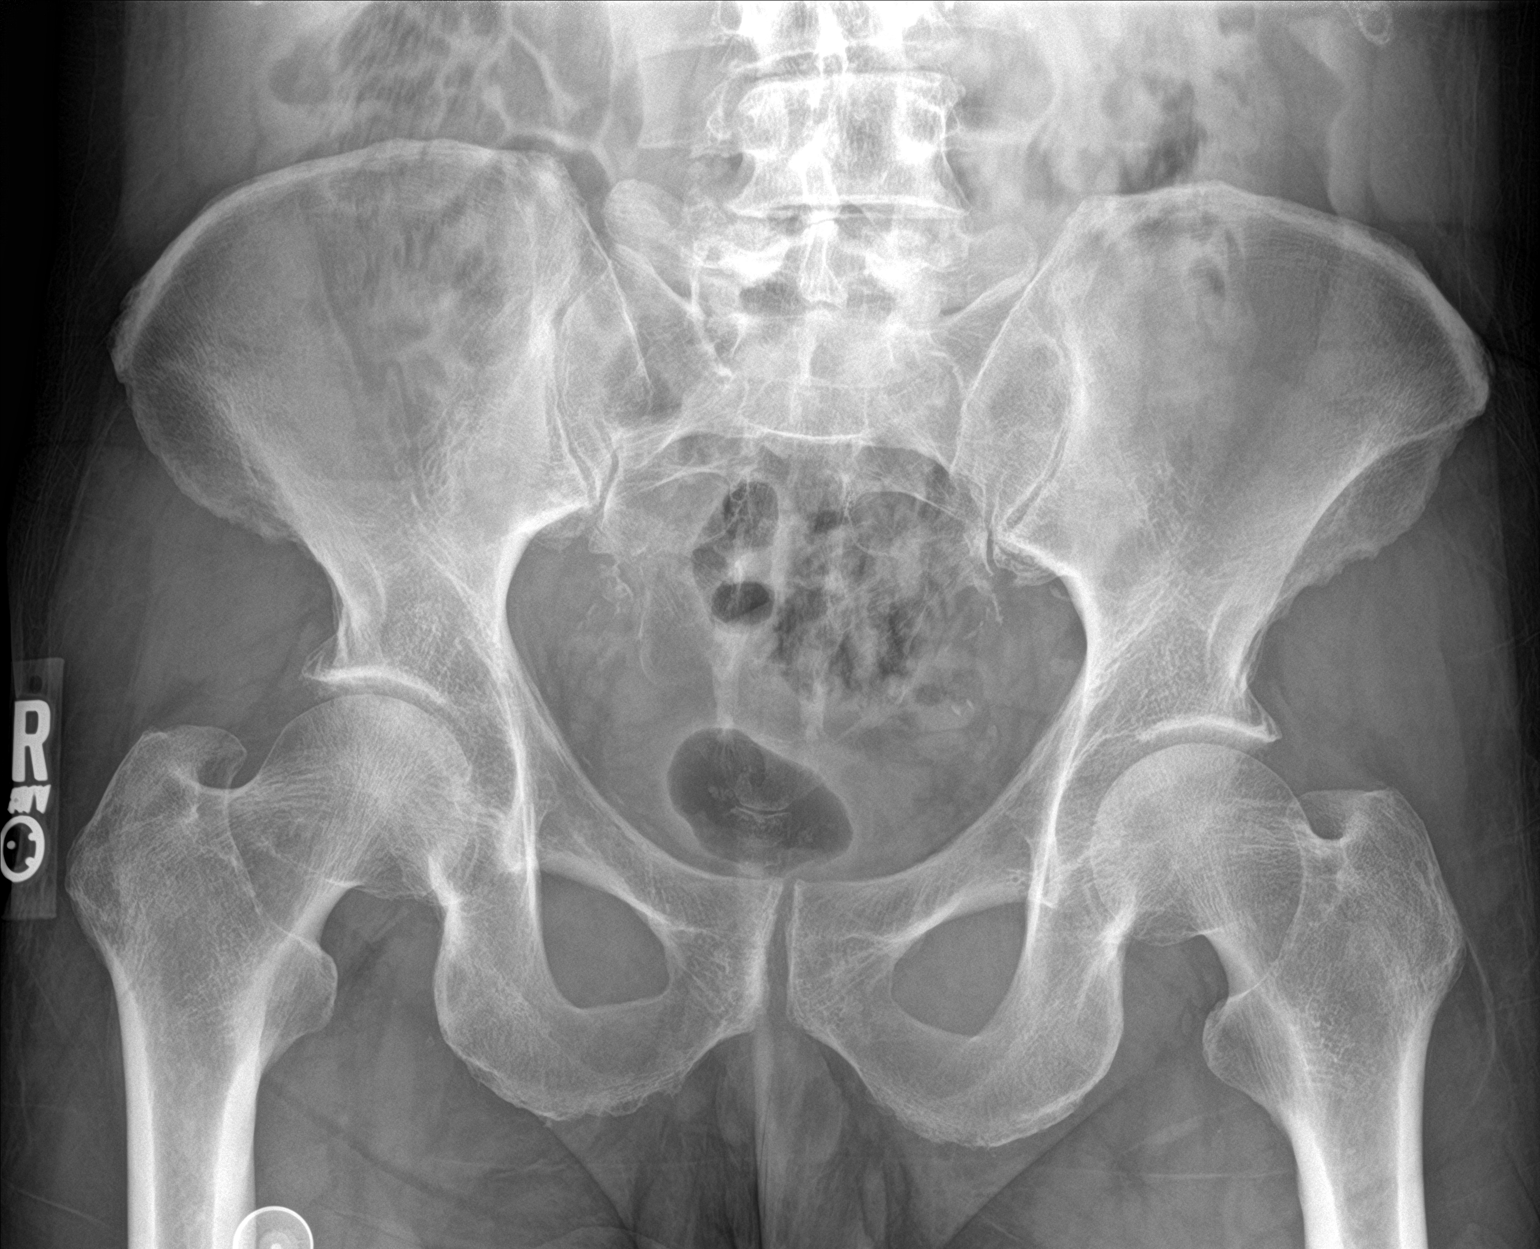

[abdomen kub (2 of 2)]
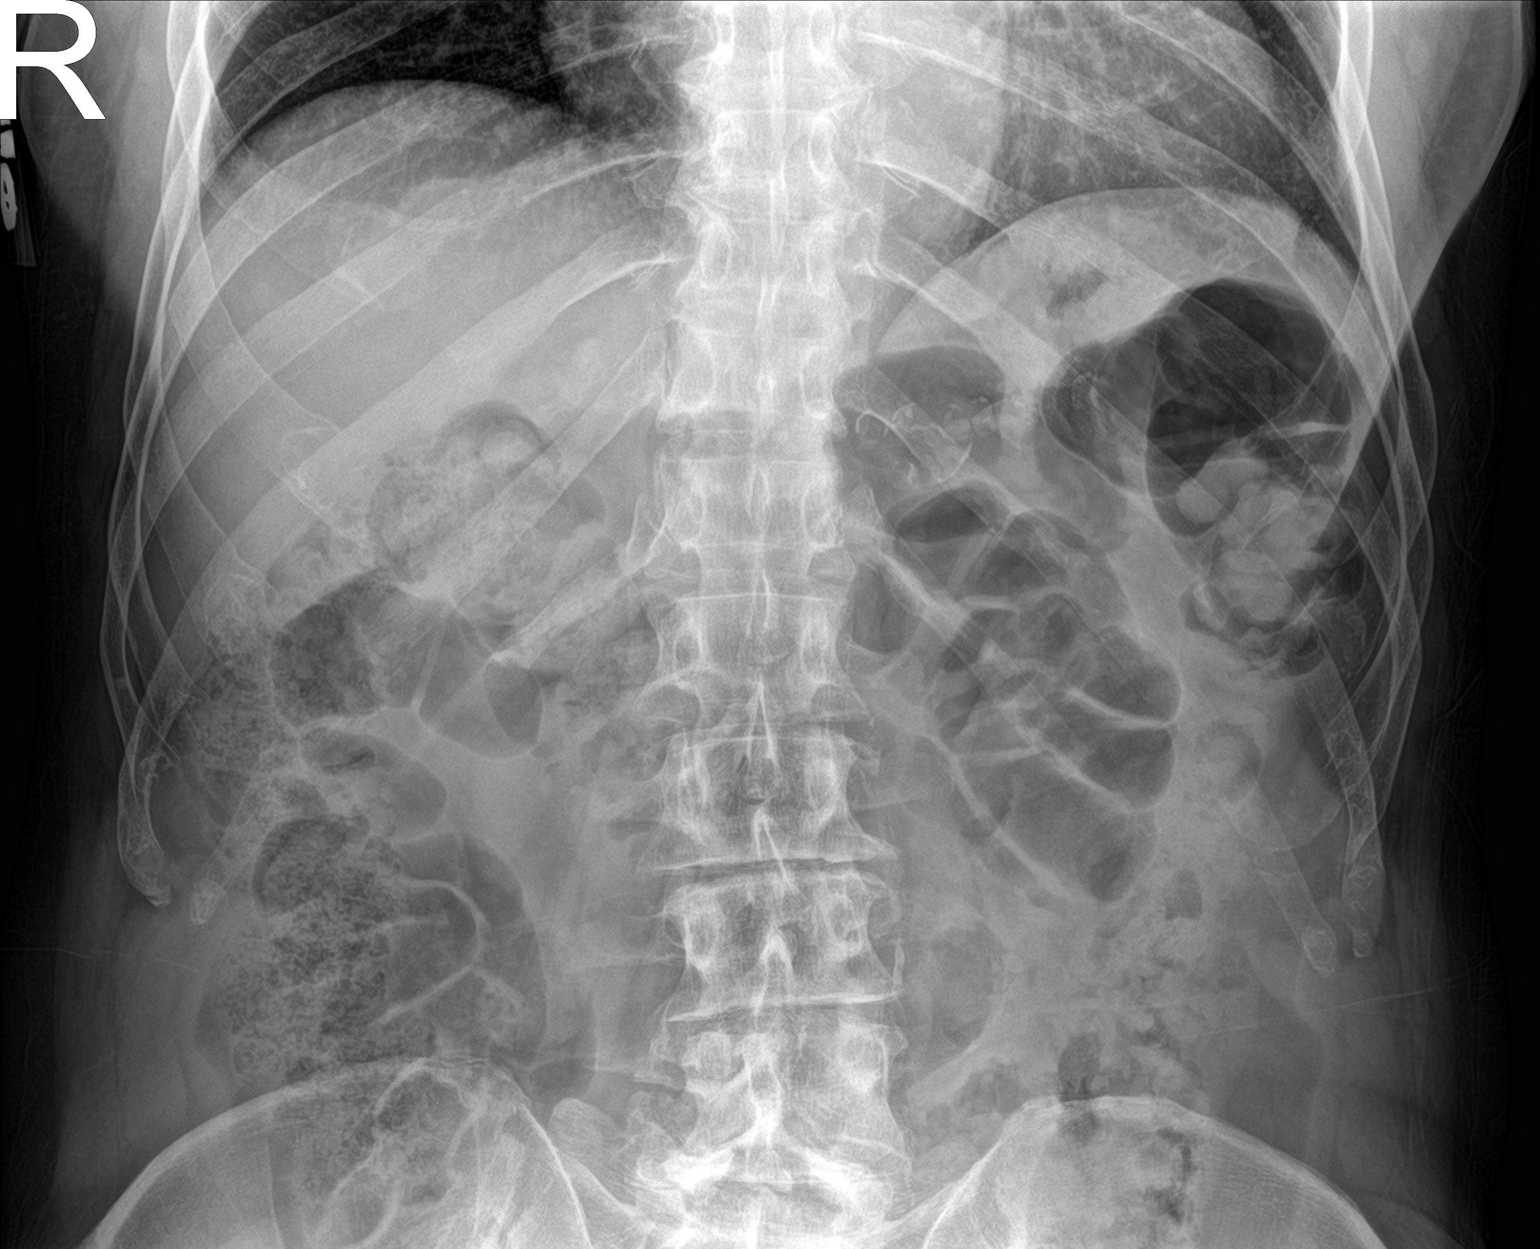

[2 of 2 positions shown; findings below may reference images not displayed]

FINDINGS: The bowel gas pattern is normal. No radio-opaque calculi or other
significant radiographic abnormality are seen. Degenerative changes
throughout the visualized lower thoracic and lumbar spine at
multiple levels. Aortoiliac atherosclerosis present. No acute
osseous finding.
IMPRESSION: No significant finding by plain radiography

Thoracolumbar degenerative change

Aortic Atherosclerosis ([id number]).

## 2023-03-29 DIAGNOSIS — R1319 Other dysphagia: Secondary | ICD-10-CM | POA: Diagnosis not present

## 2023-03-29 DIAGNOSIS — R194 Change in bowel habit: Secondary | ICD-10-CM | POA: Diagnosis not present

## 2023-03-29 DIAGNOSIS — K5909 Other constipation: Secondary | ICD-10-CM | POA: Diagnosis not present

## 2023-03-29 DIAGNOSIS — R198 Other specified symptoms and signs involving the digestive system and abdomen: Secondary | ICD-10-CM | POA: Diagnosis not present

## 2023-03-29 DIAGNOSIS — Z8719 Personal history of other diseases of the digestive system: Secondary | ICD-10-CM | POA: Diagnosis not present

## 2023-05-15 ENCOUNTER — Emergency Department
Admission: EM | Admit: 2023-05-15 | Discharge: 2023-05-15 | Disposition: A | Discharge status: Home / Self Care | Attending: Emergency Medicine | Admitting: Emergency Medicine

## 2023-05-15 ENCOUNTER — Emergency Department

## 2023-05-15 ENCOUNTER — Other Ambulatory Visit: Payer: Self-pay

## 2023-05-15 DIAGNOSIS — M25562 Pain in left knee: Secondary | ICD-10-CM

## 2023-05-15 DIAGNOSIS — R600 Localized edema: Secondary | ICD-10-CM | POA: Insufficient documentation

## 2023-05-15 DIAGNOSIS — M1712 Unilateral primary osteoarthritis, left knee: Secondary | ICD-10-CM | POA: Diagnosis not present

## 2023-05-15 DIAGNOSIS — G8929 Other chronic pain: Secondary | ICD-10-CM | POA: Insufficient documentation

## 2023-05-15 NOTE — ED Triage Notes (Signed)
 Pt reports he was lifting weights yesterday and felt something go wrong in his left knee. Pt reports pain got worse over night.

## 2023-05-15 NOTE — Discharge Instructions (Addendum)
 Please follow-up outpatient with orthopedics.  You can take ibuprofen and Tylenol scheduled for the next couple of days twice daily.  No obvious signs on x-ray outside of may be arthritis.

## 2023-05-15 NOTE — ED Provider Notes (Signed)
 [facility] Provider Note    Event Date/Time   First MD Initiated Contact with Patient 05/15/23 [id number]     (approximate)   History   Knee Pain   HPI Erik Donovan is a 73 y.o. male presenting today for left knee pain.  Patient states he has had some chronic left knee pain but it felt worse overnight.  Denies any obvious injury but does lift weights.  Does not remember twisting or injuring his knee recently.  Denies any trauma or falling on the knee.  No significant swelling, erythema, or warmth.  No fevers.     Physical Exam   Triage Vital Signs: ED Triage Vitals  Encounter Vitals Group     BP 05/15/23 0523 (!) 197/96     Systolic BP Percentile --      Diastolic BP Percentile --      Pulse Rate 05/15/23 0523 (!) 57     Resp 05/15/23 0523 18     Temp 05/15/23 0523 97.8 F (36.6 C)     Temp Source 05/15/23 0523 Oral     SpO2 05/15/23 0523 100 %     Weight 05/15/23 0523 180 lb (81.6 kg)     Height 05/15/23 0523 5\' 9"  (1.753 m)     Head Circumference --      Peak Flow --      Pain Score 05/15/23 0522 6     Pain Loc --      Pain Education --      Exclude from Growth Chart --     Most recent vital signs: Vitals:   05/15/23 0523  BP: (!) 197/96  Pulse: (!) 57  Resp: 18  Temp: 97.8 F (36.6 C)  SpO2: 100%   I have reviewed the vital signs. General:  Awake, alert, no acute distress. Head:  Normocephalic, Atraumatic. EENT:  PERRL, EOMI, Oral mucosa pink and moist, Neck is supple. Cardiovascular: Regular rate, 2+ distal pulses. Respiratory:  Normal respiratory effort, symmetrical expansion, no distress.   Extremities:  Moving all four extremities through full ROM without pain.  No significant pain with range of motion to the left knee.  Mild edema but no significant swelling.  No erythema or warmth.  No obvious pain with varus or valgus, Lachman's, or McMurray.  Able to ambulate without significant difficulty. Neuro:  Alert and oriented.   Interacting appropriately.   Skin:  Warm, dry, no rash.   Psych: Appropriate affect.    ED Results / Procedures / Treatments   Labs (all labs ordered are listed, but only abnormal results are displayed) Labs Reviewed - No data to display   EKG    RADIOLOGY Independently interpreted x-ray with no acute traumatic pathology   PROCEDURES:  Critical Care performed: No  Procedures   MEDICATIONS ORDERED IN ED: Medications - No data to display   IMPRESSION / MDM / ASSESSMENT AND PLAN / ED COURSE  I reviewed the triage vital signs and the nursing notes.                              Differential diagnosis includes, but is not limited to, acute on chronic left knee pain, osteoarthritis  Patient's presentation is most consistent with acute complicated illness / injury requiring diagnostic workup.  Patient is a 73 year old male presenting today for acute on chronic left knee pain.  Physical exam is largely unremarkable with no significant pain with  range of motion.  No erythema, significant edema, or warmth to suggest infection.  No obvious instability to represent ligamentous injury or obvious meniscal injury.  Suspect likely more arthritis.  I independently interpreted x-ray with no acute abnormalities.  Will have patient follow-up with orthopedics for outpatient management of acute on chronic symptoms.     FINAL CLINICAL IMPRESSION(S) / ED DIAGNOSES   Final diagnoses:  Acute pain of left knee     Rx / DC Orders   ED Discharge Orders          Ordered    AMB referral to orthopedics       Comments: Acute on chronic left knee pain.  Orthopedic eval and physical therapy   05/15/23 0608             Note:  This document was prepared using Dragon voice recognition software and may include unintentional dictation errors.   Janith Lima, MD 05/15/23 [phone]

## 2023-05-24 DIAGNOSIS — M1712 Unilateral primary osteoarthritis, left knee: Secondary | ICD-10-CM | POA: Diagnosis not present

## 2023-05-24 DIAGNOSIS — M25562 Pain in left knee: Secondary | ICD-10-CM | POA: Diagnosis not present

## 2023-08-25 ENCOUNTER — Emergency Department

## 2023-08-25 ENCOUNTER — Emergency Department
Admission: EM | Admit: 2023-08-25 | Discharge: 2023-08-25 | Disposition: A | Discharge status: Home / Self Care | Attending: Emergency Medicine | Admitting: Emergency Medicine

## 2023-08-25 ENCOUNTER — Encounter: Payer: Self-pay | Admitting: Intensive Care

## 2023-08-25 ENCOUNTER — Other Ambulatory Visit: Payer: Self-pay

## 2023-08-25 DIAGNOSIS — X500XXA Overexertion from strenuous movement or load, initial encounter: Secondary | ICD-10-CM | POA: Diagnosis not present

## 2023-08-25 DIAGNOSIS — M19011 Primary osteoarthritis, right shoulder: Secondary | ICD-10-CM | POA: Diagnosis not present

## 2023-08-25 DIAGNOSIS — S46211A Strain of muscle, fascia and tendon of other parts of biceps, right arm, initial encounter: Secondary | ICD-10-CM | POA: Diagnosis not present

## 2023-08-25 DIAGNOSIS — S46219A Strain of muscle, fascia and tendon of other parts of biceps, unspecified arm, initial encounter: Secondary | ICD-10-CM

## 2023-08-25 NOTE — ED Provider Notes (Signed)
 [facility] Provider Note    Event Date/Time   First MD Initiated Contact with Patient 08/25/23 1356     (approximate)   History   Arm Injury   HPI  Erik Donovan is a 73 y.o. male with PMH of hyperlipidemia presents for evaluation of an injury to the right upper arm.  Patient was doing bicep curls when he felt a pull near his shoulder. Has mild pain to the right shoulder.       Physical Exam   Triage Vital Signs: ED Triage Vitals  Encounter Vitals Group     BP 08/25/23 1343 139/81     Girls Systolic BP Percentile --      Girls Diastolic BP Percentile --      Boys Systolic BP Percentile --      Boys Diastolic BP Percentile --      Pulse Rate 08/25/23 1343 72     Resp 08/25/23 1343 16     Temp 08/25/23 1343 98.9 F (37.2 C)     Temp Source 08/25/23 1343 Oral     SpO2 08/25/23 1343 96 %     Weight 08/25/23 1344 180 lb (81.6 kg)     Height 08/25/23 1344 5' 9 (1.753 m)     Head Circumference --      Peak Flow --      Pain Score 08/25/23 1344 0     Pain Loc --      Pain Education --      Exclude from Growth Chart --     Most recent vital signs: Vitals:   08/25/23 1343  BP: 139/81  Pulse: 72  Resp: 16  Temp: 98.9 F (37.2 C)  SpO2: 96%   General: Awake, no distress.  CV:  Good peripheral perfusion.  Resp:  Normal effort.  Abd:  No distention.  Other:  Popeye deformity, distal biceps tendon is intact with hook test, TTP over the intertubercular groove.   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   PROCEDURES:  Critical Care performed: No  Procedures   MEDICATIONS  ORDERED IN ED: Medications - No data to display   IMPRESSION / MDM / ASSESSMENT AND PLAN / ED COURSE  I reviewed the triage vital signs and the nursing notes.                             73 year old male presents for evaluation of a right upper arm injury.  Vital signs are stable patient NAD on exam.  Differential diagnosis includes, but is not limited to, fracture, dislocation, muscle strain, biceps tendon tear.  Patient's presentation is most consistent with acute complicated illness / injury requiring diagnostic workup.  X-ray of the right humerus is negative for fractures.  On exam patient has a Popeye deformity but the distal biceps tendon is intact.  I believe patient has a proximal biceps tendon tear.  Will place patient in a sling and recommended follow-up with orthopedics.  He can take NSAIDs and use ice as needed for pain.  Patient voiced understanding, all questions were answered and he was stable at discharge.  FINAL CLINICAL IMPRESSION(S) / ED DIAGNOSES   Final diagnoses:  Biceps tendon tear     Rx / DC Orders   ED Discharge Orders     None        Note:  This document was prepared using Dragon voice recognition software and may include unintentional dictation errors.   Cleaster Tinnie LABOR, PA-C 08/25/23 1459    Dorothyann Drivers, MD 08/25/23 Erik Donovan

## 2023-08-25 NOTE — Discharge Instructions (Signed)
 I believe you have torn your biceps tendon at the shoulder.  The x-ray did not show any fractures.  Please keep your arm in the sling until seen by orthopedics.  You can take 650 mg of Tylenol  and 600 mg of ibuprofen every 6 hours as needed for pain. You can use ice, heat, muscle creams and other topical pain relievers as well.

## 2023-08-25 NOTE — ED Notes (Signed)
 X-ray at bedside

## 2023-08-25 NOTE — ED Triage Notes (Signed)
 Patient reports injuring right upper arm when lifting weights today

## 2023-08-25 NOTE — ED Notes (Signed)
 Pt verbalizes understanding of discharge instructions.

## 2023-08-28 DIAGNOSIS — Z125 Encounter for screening for malignant neoplasm of prostate: Secondary | ICD-10-CM | POA: Diagnosis not present

## 2023-08-28 DIAGNOSIS — R030 Elevated blood-pressure reading, without diagnosis of hypertension: Secondary | ICD-10-CM | POA: Diagnosis not present

## 2023-08-28 DIAGNOSIS — Z1331 Encounter for screening for depression: Secondary | ICD-10-CM | POA: Diagnosis not present

## 2023-08-28 DIAGNOSIS — Z0000 Encounter for general adult medical examination without abnormal findings: Secondary | ICD-10-CM | POA: Diagnosis not present

## 2023-08-28 DIAGNOSIS — Z1211 Encounter for screening for malignant neoplasm of colon: Secondary | ICD-10-CM | POA: Diagnosis not present

## 2023-08-28 DIAGNOSIS — R7303 Prediabetes: Secondary | ICD-10-CM | POA: Diagnosis not present

## 2023-08-28 DIAGNOSIS — E7800 Pure hypercholesterolemia, unspecified: Secondary | ICD-10-CM | POA: Diagnosis not present

## 2023-08-28 DIAGNOSIS — E538 Deficiency of other specified B group vitamins: Secondary | ICD-10-CM | POA: Diagnosis not present

## 2023-08-28 NOTE — Progress Notes (Signed)
 Erik Donovan is a 73 y.o. male here for follow up of their medical problems  CHIEF COMPLAINT:  Follow up medical problems in the problem list and as discussed in the history and assessment areas as well as new complaints as listed.   Patient Active Problem List  Diagnosis  . Pure hypercholesterolemia  . Health care maintenance  . Prediabetes  . Elevated blood pressure reading     HISTORY OF PRESENT ILLNESS:  Pure hypercholesterolemia Healthy fat diet is being followed and no myalgia's are noted.   Prediabetes Glucose is followed and controlled on diet   Past Medical History:  Diagnosis Date  . Chickenpox     Past Surgical History:  Procedure Laterality Date  . HERNIA REPAIR    . inguinal surgery Left      No fever chills or sweats   No nausea, vomiting or diarrhea  No chest pain, shortness of breath   Social History   Socioeconomic History  . Marital status: Single  . Number of children: 0  Occupational History  . Occupation: retired  Tobacco Use  . Smoking status: Never  . Smokeless tobacco: Never  Vaping Use  . Vaping status: Never Used  Substance and Sexual Activity  . Alcohol use: No  . Drug use: No  . Sexual activity: Defer   Social Drivers of Health   Financial Resource Strain: Low Risk  (08/28/2023)   Overall Financial Resource Strain (CARDIA)   . Difficulty of Paying Living Expenses: Not hard at all  Food Insecurity: No Food Insecurity (08/28/2023)   Hunger Vital Sign   . Worried About Programme researcher, broadcasting/film/video in the Last Year: Never true   . Ran Out of Food in the Last Year: Never true  Transportation Needs: No Transportation Needs (08/28/2023)   PRAPARE - Transportation   . Lack of Transportation (Medical): No   . Lack of Transportation (Non-Medical): No  Housing Stability: Low Risk  (08/28/2023)   Housing Stability Vital Sign   . Unable to Pay for Housing in the Last Year: No   . Number of Times Moved in the Last Year: 0   . Homeless in  the Last Year: No     No current outpatient medications on file.  Vitals:   08/28/23 1315  BP: (!) 142/79  Pulse: 58  Resp: 16   Body mass index is 29.76 kg/m. No acute distress Lungs; clear to ascultation Heart; Regular rate and rhythm  Abdomen; Soft and flat, normal bowel sounds Extremities; No clubbing, cyanosis or edema  No visits with results within 3 Month(s) from this visit.  Latest known visit with results is:  Appointment on 11/02/2022  Component Date Value Ref Range Status  . Hemoccult ICT 11/02/2022 Negative  Negative Final  . Hemoccult ICT 11/02/2022 Negative  Negative Final    ASSESSMENT  AND PLAN:  Diagnoses and all orders for this visit:  Prediabetes Assessment & Plan: Glucose is followed and controlled on diet   Orders: -     Thyroid  Stimulating-Hormone (TSH) -     Vitamin B12 -     Hemoglobin A1C -     Lipid Panel w/calc LDL -     Comprehensive Metabolic Panel (CMP)  Depression screening (Z13.31) -     Depression Screen -(PHQ- 2/9, BDI)  Pure hypercholesterolemia Assessment & Plan: Healthy fat diet is being followed and no myalgia's are noted.   Orders: -     Thyroid  Stimulating-Hormone (TSH) -  Vitamin B12 -     Hemoglobin A1C -     Lipid Panel w/calc LDL -     Comprehensive Metabolic Panel (CMP)  Elevated blood pressure reading  Colon cancer screening -     Cologuard Engineer, water Lab)  Encounter for prostate cancer screening -     PSA, Total (Screen)       PHQ 2/9 last 3 flowsheet values     07/26/2021 08/02/2022 08/28/2023  PHQ-9 Depression Screening   Little interest or pleasure in doing things  0 0  Feeling down, depressed, or hopeless  0 0  (OBSOLETE) Little interest or pleasure in doing things 0    (OBSOLETE) Feeling down, depressed, or hopeless (or irritable for Teens only)? 0    (OBSOLETE) Total Score = 0        Depression Severity and Treatment Recommendations:  0-4= None  5-9= Mild / Treatment: Support,  educate to call if worse; return in one month  10-14= Moderate / Treatment: Support, watchful waiting; Antidepressant or Psychotherapy  15-19= Moderately severe / Treatment: Antidepressant OR Psychotherapy  >= 20 = Major depression, severe / Antidepressant AND Psychotherapy  Please note approximately 15 minutes was spent and depression screening by me and nursing staff.     Flu yearly, hemocults yearly, pneumovax 12-17 recommended. Considering shingrix. Full code.   MEDICARE WELLNESS VISIT   PROVIDERS RENDERING CARE Dr. Lenon   FUNCTIONAL ASSESSMENT  (1) Hearing: Demonstrates normal hearing in conversation.  (2) Risk of Falls: No reports of falls or abnormal balance. Gait is observed to be good upon observation.  (3) Home Safety; Home is safe and secure (4) Activities of Daily Living; Household chores and grooming are managed without problems. Personal finances are managed without problems.   DEPRESSION SCREENING There does not seem to be loss of interest in activities nor excess crying or changes in sleep or appetite.   COGNITIVE SCREENING Orientation is appropriate as are responses to questions and general conversation. No reports of forgetfulness or losing things.    PREVENTION PLAN Cardiovascular: Continue exercise and healthy fat diet Diabetes: Glucose checked yearly  Colon Cancer: He wishes hemocults  Glaucoma: Yearly eye exam  Pneumonia: pneumovax 2017, considering prevnar 20 Shingles: Considering Influenza: Flu vaccine each fall  Smoking Cessation: NA   OTHER PERSONALIZED HEALTH ADVISE Goals     .  Maintain health/healthy lifestyle (pt-stated)       Continue weight lifting   END OF LIFE CARE WANTS Full code         Layman Lenon MD     We discussed an exercise program and its benefits as well as a healthy diet including healthy fats and vegetables. These are very important parts of healthy living and cardiovascular health. 15 minutes was  devoted to this.

## 2023-09-13 DIAGNOSIS — S46111A Strain of muscle, fascia and tendon of long head of biceps, right arm, initial encounter: Secondary | ICD-10-CM | POA: Diagnosis not present

## 2023-09-13 DIAGNOSIS — M19011 Primary osteoarthritis, right shoulder: Secondary | ICD-10-CM | POA: Diagnosis not present

## 2023-11-07 ENCOUNTER — Other Ambulatory Visit: Payer: Self-pay

## 2023-11-07 ENCOUNTER — Emergency Department
Admission: EM | Admit: 2023-11-07 | Discharge: 2023-11-07 | Disposition: A | Discharge status: Home / Self Care | Attending: Emergency Medicine | Admitting: Emergency Medicine

## 2023-11-07 ENCOUNTER — Emergency Department

## 2023-11-07 DIAGNOSIS — R3911 Hesitancy of micturition: Secondary | ICD-10-CM | POA: Diagnosis not present

## 2023-11-07 DIAGNOSIS — R350 Frequency of micturition: Secondary | ICD-10-CM | POA: Diagnosis present

## 2023-11-07 DIAGNOSIS — K5900 Constipation, unspecified: Secondary | ICD-10-CM | POA: Insufficient documentation

## 2023-11-07 LAB — URINALYSIS, W/ REFLEX TO CULTURE (INFECTION SUSPECTED)
Bacteria, UA: NONE SEEN
Bilirubin Urine: NEGATIVE
Glucose, UA: NEGATIVE mg/dL
Hgb urine dipstick: NEGATIVE
Ketones, ur: NEGATIVE mg/dL
Leukocytes,Ua: NEGATIVE
Nitrite: NEGATIVE
Protein, ur: NEGATIVE mg/dL
Specific Gravity, Urine: 1.008 (ref 1.005–1.030)
Squamous Epithelial / HPF: 0 /HPF (ref 0–5)
pH: 6 (ref 5.0–8.0)

## 2023-11-07 LAB — CBC WITH DIFFERENTIAL/PLATELET
Abs Immature Granulocytes: 0.02 K/uL (ref 0.00–0.07)
Basophils Absolute: 0.1 K/uL (ref 0.0–0.1)
Basophils Relative: 2 %
Eosinophils Absolute: 0.2 K/uL (ref 0.0–0.5)
Eosinophils Relative: 3 %
HCT: 44.9 % (ref 39.0–52.0)
Hemoglobin: 14.1 g/dL (ref 13.0–17.0)
Immature Granulocytes: 0 %
Lymphocytes Relative: 32 %
Lymphs Abs: 1.5 K/uL (ref 0.7–4.0)
MCH: 28.5 pg (ref 26.0–34.0)
MCHC: 31.4 g/dL (ref 30.0–36.0)
MCV: 90.9 fL (ref 80.0–100.0)
Monocytes Absolute: 0.4 K/uL (ref 0.1–1.0)
Monocytes Relative: 9 %
Neutro Abs: 2.6 K/uL (ref 1.7–7.7)
Neutrophils Relative %: 54 %
Platelets: 220 K/uL (ref 150–400)
RBC: 4.94 MIL/uL (ref 4.22–5.81)
RDW: 13.4 % (ref 11.5–15.5)
WBC: 4.8 K/uL (ref 4.0–10.5)
nRBC: 0 % (ref 0.0–0.2)

## 2023-11-07 LAB — COMPREHENSIVE METABOLIC PANEL WITH GFR
ALT: 17 U/L (ref 0–44)
AST: 28 U/L (ref 15–41)
Albumin: 3.9 g/dL (ref 3.5–5.0)
Alkaline Phosphatase: 52 U/L (ref 38–126)
Anion gap: 11 (ref 5–15)
BUN: 18 mg/dL (ref 8–23)
CO2: 24 mmol/L (ref 22–32)
Calcium: 9.1 mg/dL (ref 8.9–10.3)
Chloride: 105 mmol/L (ref 98–111)
Creatinine, Ser: 0.97 mg/dL (ref 0.61–1.24)
GFR, Estimated: >60 mL/min (ref >60)
Glucose, Bld: 95 mg/dL (ref 70–99)
Potassium: 4 mmol/L (ref 3.5–5.1)
Sodium: 140 mmol/L (ref 135–145)
Total Bilirubin: 1.6 mg/dL — ABNORMAL HIGH (ref 0.0–1.2)
Total Protein: 7 g/dL (ref 6.5–8.1)

## 2023-11-07 LAB — LIPASE, BLOOD: Lipase: 29 U/L (ref 11–51)

## 2023-11-07 MED ORDER — DOCUSATE SODIUM 100 MG PO CAPS: 100.0000 mg | ORAL_CAPSULE | Freq: Every day | ORAL | 0 refills | Status: AC

## 2023-11-07 MED ORDER — TAMSULOSIN HCL 0.4 MG PO CAPS: 0.4000 mg | ORAL_CAPSULE | Freq: Every day | ORAL | 0 refills | Status: AC

## 2023-11-07 NOTE — ED Triage Notes (Signed)
 Patient ambulatory to triage with complaints of constipation and urinary frequency. Patient states he had normal BM yesterday but has only been able to pass small balls of stool. Denies feeling of stool ball at rectum. Denies abdominal pain/vomiting. Patient states he is needing to urinate approximately every hour. Has not taken any OTC meds PTA.

## 2023-11-07 NOTE — Discharge Instructions (Signed)
 We suspect that you likely have BPH, benign prostatic hyperplasia, or in layman's terms, and enlarged prostate.  Start taking the tamsulosin  nightly as this should help your urinary symptoms.  You can also take the Colace once or twice daily to help soften the stool.  You should follow-up with the urologist.  Referral information has been provided; you will need to call to set up the appointment.  Return to the ER for new, worsening, or persistent severe urinary symptoms, constipation, abdominal pain, or any other new or worsening symptoms that concern you.

## 2023-11-07 NOTE — ED Provider Notes (Signed)
 [facility] Provider Note    Event Date/Time   First MD Initiated Contact with Patient 11/07/23 [phone]     (approximate)   History   Constipation and Urinary Frequency   HPI  Erik Donovan is a 73 y.o. male with a history of prediabetes and hypercholesterolemia who presents with urinary frequency.  He states that this has been going on for months and he sometimes has to go to the bathroom as often as once an hour.  He states he has difficulty initiating the stream and only a small amount of urine comes out.  He denies any dysuria or hematuria.  He also reports constipation although on further history, he states that actually has a bowel movement about once per day.  The stool is sometimes hard or dry, but he does not have difficulty with bowel movements.  He denies any abdominal pain or although does state that he frequently has to burp, and feels gassy.  I reviewed the past medical records.  The patient's most recent outpatient encounter was with internal medicine on 6/24 for follow-up of his chronic conditions.   Physical Exam   Triage Vital Signs: ED Triage Vitals  Encounter Vitals Group     BP 11/07/23 0633 (!) 177/100     Girls Systolic BP Percentile --      Girls Diastolic BP Percentile --      Boys Systolic BP Percentile --      Boys Diastolic BP Percentile --      Pulse Rate 11/07/23 0633 (!) 58     Resp 11/07/23 0633 18     Temp 11/07/23 0633 97.9 F (36.6 C)     Temp Source 11/07/23 0633 Oral     SpO2 11/07/23 0633 97 %     Weight 11/07/23 0633 190 lb (86.2 kg)     Height 11/07/23 0633 5' 9 (1.753 m)     Head Circumference --      Peak Flow --      Pain Score 11/07/23 0634 0     Pain Loc --      Pain Education --      Exclude from Growth Chart --     Most recent vital signs: Vitals:   11/07/23 0633  BP: (!) 177/100  Pulse: (!) 58  Resp: 18  Temp: 97.9 F (36.6 C)  SpO2: 97%     General: Awake, no distress.  CV:  Good  peripheral perfusion.  Resp:  Normal effort.  Abd:  No distention.  Other:  No jaundice or scleral icterus.   ED Results / Procedures / Treatments   Labs (all labs ordered are listed, but only abnormal results are displayed) Labs Reviewed  URINALYSIS, W/ REFLEX TO CULTURE (INFECTION SUSPECTED) - Abnormal; Notable for the following components:      Result Value   Color, Urine STRAW (*)    APPearance CLEAR (*)    All other components within normal limits  COMPREHENSIVE METABOLIC PANEL WITH GFR - Abnormal; Notable for the following components:   Total Bilirubin 1.6 (*)    All other components within normal limits  CBC WITH DIFFERENTIAL/PLATELET  LIPASE, BLOOD     EKG    RADIOLOGY  XR abdomen: I independently viewed and interpreted the images; there are no dilated bowel loops or any air-fluid levels.  Radiology report indicates an average stool burden and no acute findings.  PROCEDURES:  Critical Care performed: No  Procedures   MEDICATIONS ORDERED  IN ED: Medications - No data to display   IMPRESSION / MDM / ASSESSMENT AND PLAN / ED COURSE  I reviewed the triage vital signs and the nursing notes.  73 year old male with PMH as noted above presents with urinary frequency and what he reported is constipation, both of which are chronic.  He states he came because he could not sleep.  He has a colonoscopy scheduled in a few months but was hoping something could be done sooner.  Differential diagnosis includes, but is not limited to, UTI, BPH, prostatitis, simple constipation.  On further history it does not seem that the patient is actually significantly constipated.  He has hard stools but goes regularly.  He might benefit from a stool softener.  We will obtain an x-ray to evaluate for a large stool burden.  In terms of the dysuria we will obtain a urinalysis and basic labs to check his renal function.  If the urinalysis is negative I suspect that this is from  BPH.  Patient's presentation is most consistent with acute complicated illness / injury requiring diagnostic workup.  ----------------------------------------- 9:50 AM on 11/07/2023 -----------------------------------------  Abdominal x-ray is negative.  CMP and CBC show no acute findings.  Lipase is normal.  Urinalysis shows no evidence of infection.  Overall presentation is most consistent with BPH.  I will start the patient on tamsulosin  and refer him to urology.  I also recommended Colace.  Even though the patient is going to the bathroom daily, he does report very hard dry stools so a stool softener likely would help.  I counseled the patient on the results of the workup and plan of care.  I gave him strict return precautions and he expressed understanding.  He is stable for discharge at this time.  FINAL CLINICAL IMPRESSION(S) / ED DIAGNOSES   Final diagnoses:  Urinary hesitancy  Constipation, unspecified constipation type     Rx / DC Orders   ED Discharge Orders          Ordered    tamsulosin  (FLOMAX ) 0.4 MG CAPS capsule  Daily after supper        11/07/23 0948    docusate sodium  (COLACE) 100 MG capsule  Daily        11/07/23 0948             Note:  This document was prepared using Dragon voice recognition software and may include unintentional dictation errors.    Jacolyn Pae, MD 11/07/23 [phone]

## 2023-11-26 ENCOUNTER — Ambulatory Visit: Admitting: Urology

## 2023-12-12 ENCOUNTER — Telehealth: Payer: Self-pay | Admitting: Internal Medicine

## 2023-12-12 NOTE — Telephone Encounter (Signed)
 Request received to transfer GI care from outside practice to [facility].  We appreciate the interest in our practice, however at this time due to high demand from patients without established GI providers we cannot accommodate this transfer.  I would recommend that you encourage the patient to re0contact [facility] for follow-up in [place] where he was seen earlier this year.  It looks like they tried to recommend upper endoscopy and colonoscopy at that time but transportation was an issue.

## 2023-12-12 NOTE — Telephone Encounter (Signed)
 Good Morning Dr.Pyrtle,   DOD AM 12/12/23  Patient calling wanting to be scheduled for chronic constipation. Patient was seen at [facility] January 2025. Patient stated nothing has helped and he continues to have trouble going to bathroom. Patient was seen back in 2001 by Dr.Stark then went off to [place] in 2023 and this year [facility]. Patient does not have a specific provider he would like to schedule with. Records can be found in Epic.  Please advise for future scheduling  Thank you

## 2023-12-19 DIAGNOSIS — R399 Unspecified symptoms and signs involving the genitourinary system: Secondary | ICD-10-CM | POA: Insufficient documentation

## 2023-12-19 NOTE — Progress Notes (Signed)
   12/24/23 12:01 PM   Erik Donovan 01-Jun-1950 [id number]   HPI: 73 y.o. male here for initial evaluation of LUTS ED visit (11/07/23) - urinary frequency, small volume voids   - UA negative, KUB negative   - started on Flomax  -although he denies taking  Several year history of progressive LUTS Mixed symptoms-most bothersome especially dribbling Drinks significant daily teas and sodas, drinks tea with every meal History of GI related issues, melanic stool, constipation with firm hard stool that sometimes requires manual disimpaction  No personal history of prior GU conditions, denies GH, denies UTIs, denies nephrolithiasis, denies prostatitis No family history of GU malignancies   PMH: Past Medical History:  Diagnosis Date   HLD (hyperlipidemia)     Surgical History: Past Surgical History:  Procedure Laterality Date   ESOPHAGOGASTRODUODENOSCOPY (EGD) WITH PROPOFOL  N/A 06/10/2021   Procedure: ESOPHAGOGASTRODUODENOSCOPY (EGD) WITH PROPOFOL ;  Surgeon: Jinny Carmine, MD;  Location: [facility] ENDOSCOPY;  Service: Endoscopy;  Laterality: N/A;   HERNIA REPAIR      Family History: Family History  Problem Relation Age of Onset   Heart failure Mother    Heart attack Brother     Social History:  reports that he has never smoked. He has never used smokeless tobacco. He reports that he does not drink alcohol and does not use drugs.      Physical Exam: BP (!) 188/97   Pulse 68   Ht 5' 9 (1.753 m)   Wt 180 lb (81.6 kg)   BMI 26.58 kg/m    Constitutional:  Alert and oriented, No acute distress. Cardiovascular: No clubbing, cyanosis, or edema. Respiratory: Normal respiratory effort, no increased work of breathing. GI: Nondistended Skin: No rashes, bruises or suspicious lesions. Neurologic: Grossly intact, no focal deficits, moving all 4 extremities. Psychiatric: Normal mood and affect.  Laboratory Data: UA negative 11/07/23   Pertinent Imaging: N/A    Assessment & Plan:     Lower urinary tract symptoms (LUTS) Assessment & Plan: Progressive LUTS Constipation with impaction Significant TE and soda intake IPSS 18/4 PVR 59cc today  Today we reviewed the physiology and common causes of male lower urinary tract symptoms (LUTS). Discussed potential etiologies including infectious, inflammatory, bladder-related, benign prostatic hyperplasia (BPH), and musculoskeletal/pelvic floor contributions. Reviewed the standard diagnostic workup (urinalysis, PVR, uroflow, prostate assessment, possible cystoscopy or imaging) and the spectrum of initial management strategies ranging from behavioral and lifestyle measures to pharmacologic therapy, with procedural options if indicated. All questions were addressed and the patient expressed understanding of the evaluation and treatment pathway.  -Attention to several modifiable risk factors including significant daily tea and soda consumption, as well as constipation.  These can greatly exacerbate underlying BPH/OAB pathology  - Reviewed the role of empiric alpha-blocker therapy.  He was previously prescribed this although did not take.  We can certainly try this in the future if symptoms worsen - Lastly, reviewed more aggressive workup with cystoscopy/TRUS prostate sizing  Orders: -     Bladder Scan (Post Void Residual) in office      Penne Skye, MD 12/24/2023  [facility] [address] [place], [place] [address] [phone]

## 2023-12-19 NOTE — Assessment & Plan Note (Addendum)
 Progressive LUTS Constipation with impaction Significant TE and soda intake IPSS 18/4 PVR 59cc today  Today we reviewed the physiology and common causes of male lower urinary tract symptoms (LUTS). Discussed potential etiologies including infectious, inflammatory, bladder-related, benign prostatic hyperplasia (BPH), and musculoskeletal/pelvic floor contributions. Reviewed the standard diagnostic workup (urinalysis, PVR, uroflow, prostate assessment, possible cystoscopy or imaging) and the spectrum of initial management strategies ranging from behavioral and lifestyle measures to pharmacologic therapy, with procedural options if indicated. All questions were addressed and the patient expressed understanding of the evaluation and treatment pathway.  -Attention to several modifiable risk factors including significant daily tea and soda consumption, as well as constipation.  These can greatly exacerbate underlying BPH/OAB pathology  - Reviewed the role of empiric alpha-blocker therapy.  He was previously prescribed this although did not take.  We can certainly try this in the future if symptoms worsen - Lastly, reviewed more aggressive workup with cystoscopy/TRUS prostate sizing

## 2023-12-24 ENCOUNTER — Encounter: Payer: Self-pay | Admitting: Urology

## 2023-12-24 ENCOUNTER — Ambulatory Visit: Admitting: Urology

## 2023-12-24 VITALS — BP 188/97 | HR 68 | Ht 69.0 in | Wt 180.0 lb

## 2023-12-24 DIAGNOSIS — R399 Unspecified symptoms and signs involving the genitourinary system: Secondary | ICD-10-CM | POA: Diagnosis not present

## 2023-12-24 LAB — BLADDER SCAN AMB NON-IMAGING: Scan Result: 59

## 2024-04-03 ENCOUNTER — Other Ambulatory Visit: Payer: Self-pay

## 2024-04-03 ENCOUNTER — Emergency Department

## 2024-04-03 ENCOUNTER — Emergency Department
Admission: EM | Admit: 2024-04-03 | Discharge: 2024-04-03 | Disposition: A | Discharge status: Home / Self Care | Attending: Emergency Medicine | Admitting: Emergency Medicine

## 2024-04-03 DIAGNOSIS — W010XXA Fall on same level from slipping, tripping and stumbling without subsequent striking against object, initial encounter: Secondary | ICD-10-CM | POA: Insufficient documentation

## 2024-04-03 DIAGNOSIS — W19XXXA Unspecified fall, initial encounter: Secondary | ICD-10-CM

## 2024-04-03 DIAGNOSIS — S7001XA Contusion of right hip, initial encounter: Secondary | ICD-10-CM | POA: Diagnosis not present

## 2024-04-03 DIAGNOSIS — S79911A Unspecified injury of right hip, initial encounter: Secondary | ICD-10-CM | POA: Diagnosis present

## 2024-04-03 MED ORDER — LIDOCAINE 5 % EX PTCH
1.0000 | MEDICATED_PATCH | CUTANEOUS | Status: DC
  Administered 2024-04-03: 1 via TRANSDERMAL
  Filled 2024-04-03: qty 1

## 2024-04-03 NOTE — ED Provider Notes (Signed)
 "  [facility] Provider Note    Event Date/Time   First MD Initiated Contact with Patient 04/03/24 1803     (approximate)   History   Fall   HPI  Erik Donovan is a 74 y.o. male who presents today for evaluation after a slip and fall on the ice today.  Patient reports he landed on his right hip.  He has been able to ambulate since this occurred, though he has pain with ambulation.  He denies numbness or tingling.  Patient Active Problem List   Diagnosis Date Noted   Lower urinary tract symptoms (LUTS) 12/19/2023   Melena    Duodenal ulceration    Stricture and stenosis of esophagus    GI bleeding 06/09/2021   HLD (hyperlipidemia) 06/09/2021   Acute upper GI bleed           Physical Exam   Triage Vital Signs: ED Triage Vitals  Encounter Vitals Group     BP 04/03/24 1555 (!) 169/92     Girls Systolic BP Percentile --      Girls Diastolic BP Percentile --      Boys Systolic BP Percentile --      Boys Diastolic BP Percentile --      Pulse Rate 04/03/24 1555 60     Resp 04/03/24 1555 18     Temp 04/03/24 1555 98 F (36.7 C)     Temp src --      SpO2 04/03/24 1555 99 %     Weight 04/03/24 1554 190 lb (86.2 kg)     Height 04/03/24 1554 5' 10 (1.778 m)     Head Circumference --      Peak Flow --      Pain Score 04/03/24 1554 7     Pain Loc --      Pain Education --      Exclude from Growth Chart --     Most recent vital signs: Vitals:   04/03/24 1555 04/03/24 1929  BP: (!) 169/92 (!) 184/94  Pulse: 60 (!) 57  Resp: 18 18  Temp: 98 F (36.7 C) (!) 97.5 F (36.4 C)  SpO2: 99%     Physical Exam Vitals and nursing note reviewed.  Constitutional:      General: Awake and alert. No acute distress.    Appearance: Normal appearance. The patient is normal weight.  HENT:     Head: Normocephalic and atraumatic.     Mouth: Mucous membranes are moist.  Eyes:     General: PERRL. Normal EOMs        Right eye: No discharge.        Left  eye: No discharge.     Conjunctiva/sclera: Conjunctivae normal.  Cardiovascular:     Rate and Rhythm: Normal rate     Pulses: Normal pulses.  Pulmonary:     Effort: Pulmonary effort is normal. No respiratory distress.     Breath sounds: Normal breath sounds.  Abdominal:     Abdomen is soft. There is no abdominal tenderness. No rebound or guarding. No distention.  No ecchymosis Musculoskeletal:        General: No swelling. Normal range of motion.     Cervical back: Normal range of motion and neck supple.  Pelvis stable.  Normal internal and external rotation against resistance.  Able to flex and extend both hips against resistance.  Normal distal pulses.  Pelvis is stable. Skin:    General: Skin is warm  and dry.     Capillary Refill: Capillary refill takes less than 2 seconds.     Findings: No rash.  Neurological:     Mental Status: The patient is awake and alert.      ED Results / Procedures / Treatments   Labs (all labs ordered are listed, but only abnormal results are displayed) Labs Reviewed - No data to display   EKG     RADIOLOGY I independently reviewed and interpreted imaging and agree with radiologists findings.     PROCEDURES:  Critical Care performed:   Procedures   MEDICATIONS ORDERED IN ED: Medications  lidocaine  (LIDODERM ) 5 % 1 patch (1 patch Transdermal Patch Applied 04/03/24 1938)     IMPRESSION / MDM / ASSESSMENT AND PLAN / ED COURSE  I reviewed the triage vital signs and the nursing notes.   Differential diagnosis includes, but is not limited to, contusion, fracture, dislocation.  Patient is awake and alert, hemodynamically stable and afebrile.  He is nontoxic in appearance.  He is a normal gait.  He is able to flex and extend both hips against resistance.  He has normal internal and external rotation of both hips against resistance.  No numbness or tingling.  No lower back pain to suggest compression fracture.  X-ray obtained in triage is  negative for any acute injuries.  Patient is reassured by these results.  He was given a Lidoderm  patch for pain control.  We discussed return precautions and outpatient follow-up.  Patient understands and agrees with plan.  He was discharged in stable condition   Patient's presentation is most consistent with acute complicated illness / injury requiring diagnostic workup.    FINAL CLINICAL IMPRESSION(S) / ED DIAGNOSES   Final diagnoses:  Fall, initial encounter  Contusion of right hip, initial encounter     Rx / DC Orders   ED Discharge Orders     None        Note:  This document was prepared using Dragon voice recognition software and may include unintentional dictation errors.   Elsia Lasota E, PA-C 04/03/24 2253    Siadecki, Sebastian, MD 04/04/24 0007  "

## 2024-04-03 NOTE — ED Triage Notes (Signed)
 Pt comes with slip and fall on ice. Pt hurt his right hip. Pt denies any other injuries. Pt denies any loc or hitting head.

## 2024-04-03 NOTE — Discharge Instructions (Signed)
 Your xray is normal.  Please follow-up with your outpatient provider.  Please return for new, worsening, or changing symptoms or other concerns.  It was a pleasure caring for you today.
